# Patient Record
Sex: Male | Born: 1960 | Race: White | Hispanic: No | Marital: Married | Smoking: Never smoker
Health system: Southern US, Community
[De-identification: ages and names within clinical notes are randomized; demographics above are authoritative.]

## PROBLEM LIST (undated history)

## (undated) DIAGNOSIS — G709 Myoneural disorder, unspecified: Secondary | ICD-10-CM

## (undated) DIAGNOSIS — I499 Cardiac arrhythmia, unspecified: Secondary | ICD-10-CM

## (undated) DIAGNOSIS — K219 Gastro-esophageal reflux disease without esophagitis: Secondary | ICD-10-CM

## (undated) DIAGNOSIS — T753XXA Motion sickness, initial encounter: Secondary | ICD-10-CM

## (undated) DIAGNOSIS — S060X9A Concussion with loss of consciousness of unspecified duration, initial encounter: Secondary | ICD-10-CM

## (undated) DIAGNOSIS — J45909 Unspecified asthma, uncomplicated: Secondary | ICD-10-CM

## (undated) HISTORY — PX: NASAL SINUS SURGERY: SHX719

## (undated) HISTORY — PX: TONSILLECTOMY: SUR1361

---

## 2004-03-21 ENCOUNTER — Ambulatory Visit: Payer: Self-pay | Admitting: Otolaryngology

## 2005-03-11 ENCOUNTER — Ambulatory Visit: Payer: Self-pay | Admitting: Internal Medicine

## 2005-05-22 ENCOUNTER — Ambulatory Visit: Payer: Self-pay | Admitting: Gastroenterology

## 2009-04-30 ENCOUNTER — Ambulatory Visit: Payer: Self-pay | Admitting: Internal Medicine

## 2011-07-20 ENCOUNTER — Ambulatory Visit: Payer: Self-pay | Admitting: Internal Medicine

## 2013-10-13 DIAGNOSIS — J302 Other seasonal allergic rhinitis: Secondary | ICD-10-CM | POA: Insufficient documentation

## 2013-10-13 DIAGNOSIS — J4530 Mild persistent asthma, uncomplicated: Secondary | ICD-10-CM | POA: Insufficient documentation

## 2013-10-13 DIAGNOSIS — K219 Gastro-esophageal reflux disease without esophagitis: Secondary | ICD-10-CM | POA: Insufficient documentation

## 2014-04-15 ENCOUNTER — Emergency Department: Payer: Self-pay | Admitting: Emergency Medicine

## 2014-04-15 LAB — CBC
HCT: 43.6 % (ref 40.0–52.0)
HGB: 14.3 g/dL (ref 13.0–18.0)
MCH: 30.4 pg (ref 26.0–34.0)
MCHC: 32.8 g/dL (ref 32.0–36.0)
MCV: 93 fL (ref 80–100)
PLATELETS: 193 10*3/uL (ref 150–440)
RBC: 4.69 10*6/uL (ref 4.40–5.90)
RDW: 12.6 % (ref 11.5–14.5)
WBC: 9.2 10*3/uL (ref 3.8–10.6)

## 2014-04-15 LAB — COMPREHENSIVE METABOLIC PANEL
ALBUMIN: 3.6 g/dL (ref 3.4–5.0)
ALK PHOS: 45 U/L — AB
ALT: 29 U/L
Anion Gap: 7 (ref 7–16)
BUN: 10 mg/dL (ref 7–18)
Bilirubin,Total: 0.8 mg/dL (ref 0.2–1.0)
CALCIUM: 8.4 mg/dL — AB (ref 8.5–10.1)
CO2: 24 mmol/L (ref 21–32)
Chloride: 110 mmol/L — ABNORMAL HIGH (ref 98–107)
Creatinine: 0.9 mg/dL (ref 0.60–1.30)
EGFR (African American): >60
GLUCOSE: 94 mg/dL (ref 65–99)
Osmolality: 280 (ref 275–301)
Potassium: 3.9 mmol/L (ref 3.5–5.1)
SGOT(AST): 33 U/L (ref 15–37)
Sodium: 141 mmol/L (ref 136–145)
Total Protein: 6.6 g/dL (ref 6.4–8.2)

## 2014-04-15 LAB — TROPONIN I: Troponin-I: <0.02 ng/mL

## 2014-06-01 ENCOUNTER — Ambulatory Visit: Payer: Self-pay | Admitting: Family Medicine

## 2014-06-09 ENCOUNTER — Ambulatory Visit: Payer: Self-pay | Admitting: Family Medicine

## 2014-08-01 DIAGNOSIS — S060X9A Concussion with loss of consciousness of unspecified duration, initial encounter: Secondary | ICD-10-CM

## 2014-08-01 DIAGNOSIS — S060XAA Concussion with loss of consciousness status unknown, initial encounter: Secondary | ICD-10-CM

## 2014-08-01 HISTORY — DX: Concussion with loss of consciousness of unspecified duration, initial encounter: S06.0X9A

## 2014-08-01 HISTORY — DX: Concussion with loss of consciousness status unknown, initial encounter: S06.0XAA

## 2014-08-04 ENCOUNTER — Emergency Department: Admit: 2014-08-04 | Disposition: A | Discharge status: Home / Self Care | Payer: Self-pay | Admitting: Internal Medicine

## 2014-08-15 ENCOUNTER — Encounter: Payer: Self-pay | Admitting: *Deleted

## 2014-08-18 NOTE — Discharge Instructions (Signed)

## 2014-08-19 ENCOUNTER — Ambulatory Visit: Payer: BLUE CROSS/BLUE SHIELD | Admitting: Anesthesiology

## 2014-08-19 ENCOUNTER — Encounter
Admission: RE | Disposition: A | Discharge status: Home / Self Care | Payer: Self-pay | Source: Ambulatory Visit | Attending: Gastroenterology

## 2014-08-19 ENCOUNTER — Ambulatory Visit
Admission: RE | Admit: 2014-08-19 | Discharge: 2014-08-19 | Disposition: A | Discharge status: Home / Self Care | Payer: BLUE CROSS/BLUE SHIELD | Source: Ambulatory Visit | Attending: Gastroenterology | Admitting: Gastroenterology

## 2014-08-19 DIAGNOSIS — D124 Benign neoplasm of descending colon: Secondary | ICD-10-CM | POA: Diagnosis not present

## 2014-08-19 DIAGNOSIS — G709 Myoneural disorder, unspecified: Secondary | ICD-10-CM | POA: Diagnosis not present

## 2014-08-19 DIAGNOSIS — I499 Cardiac arrhythmia, unspecified: Secondary | ICD-10-CM | POA: Diagnosis not present

## 2014-08-19 DIAGNOSIS — R1032 Left lower quadrant pain: Secondary | ICD-10-CM | POA: Insufficient documentation

## 2014-08-19 DIAGNOSIS — K219 Gastro-esophageal reflux disease without esophagitis: Secondary | ICD-10-CM | POA: Insufficient documentation

## 2014-08-19 DIAGNOSIS — K5730 Diverticulosis of large intestine without perforation or abscess without bleeding: Secondary | ICD-10-CM | POA: Diagnosis not present

## 2014-08-19 DIAGNOSIS — R1013 Epigastric pain: Secondary | ICD-10-CM | POA: Insufficient documentation

## 2014-08-19 DIAGNOSIS — K640 First degree hemorrhoids: Secondary | ICD-10-CM | POA: Diagnosis not present

## 2014-08-19 DIAGNOSIS — Z1211 Encounter for screening for malignant neoplasm of colon: Secondary | ICD-10-CM | POA: Insufficient documentation

## 2014-08-19 DIAGNOSIS — J45909 Unspecified asthma, uncomplicated: Secondary | ICD-10-CM | POA: Diagnosis not present

## 2014-08-19 DIAGNOSIS — Z79899 Other long term (current) drug therapy: Secondary | ICD-10-CM | POA: Diagnosis not present

## 2014-08-19 DIAGNOSIS — K635 Polyp of colon: Secondary | ICD-10-CM | POA: Insufficient documentation

## 2014-08-19 DIAGNOSIS — K2970 Gastritis, unspecified, without bleeding: Secondary | ICD-10-CM | POA: Diagnosis not present

## 2014-08-19 DIAGNOSIS — Z9889 Other specified postprocedural states: Secondary | ICD-10-CM | POA: Insufficient documentation

## 2014-08-19 HISTORY — DX: Motion sickness, initial encounter: T75.3XXA

## 2014-08-19 HISTORY — DX: Cardiac arrhythmia, unspecified: I49.9

## 2014-08-19 HISTORY — DX: Concussion with loss of consciousness of unspecified duration, initial encounter: S06.0X9A

## 2014-08-19 HISTORY — PX: COLONOSCOPY: SHX5424

## 2014-08-19 HISTORY — PX: ESOPHAGOGASTRODUODENOSCOPY: SHX5428

## 2014-08-19 HISTORY — DX: Gastro-esophageal reflux disease without esophagitis: K21.9

## 2014-08-19 HISTORY — DX: Unspecified asthma, uncomplicated: J45.909

## 2014-08-19 HISTORY — DX: Myoneural disorder, unspecified: G70.9

## 2014-08-19 SURGERY — COLONOSCOPY
Anesthesia: Monitor Anesthesia Care | Wound class: Contaminated

## 2014-08-19 MED ORDER — ACETAMINOPHEN 325 MG PO TABS
325.0000 mg | ORAL_TABLET | ORAL | Status: DC | PRN
Start: 2014-08-19 — End: 2014-08-19

## 2014-08-19 MED ORDER — PROPOFOL 10 MG/ML IV BOLUS
INTRAVENOUS | Status: DC | PRN
  Administered 2014-08-19 (×3): 50 mg via INTRAVENOUS
  Administered 2014-08-19: 30 mg via INTRAVENOUS
  Administered 2014-08-19: 75 mg via INTRAVENOUS
  Administered 2014-08-19: 50 mg via INTRAVENOUS
  Administered 2014-08-19: 30 mg via INTRAVENOUS
  Administered 2014-08-19: 25 mg via INTRAVENOUS
  Administered 2014-08-19: 20 mg via INTRAVENOUS
  Administered 2014-08-19 (×2): 50 mg via INTRAVENOUS
  Administered 2014-08-19: 20 mg via INTRAVENOUS

## 2014-08-19 MED ORDER — ACETAMINOPHEN 160 MG/5ML PO SOLN
325.0000 mg | ORAL | Status: DC | PRN
Start: 2014-08-19 — End: 2014-08-19

## 2014-08-19 MED ORDER — STERILE WATER FOR IRRIGATION IR SOLN
Status: DC | PRN
  Administered 2014-08-19: 09:00:00

## 2014-08-19 MED ORDER — LACTATED RINGERS IV SOLN
INTRAVENOUS | Status: DC
  Administered 2014-08-19: 09:00:00 via INTRAVENOUS

## 2014-08-19 MED ORDER — GLYCOPYRROLATE 0.2 MG/ML IJ SOLN
INTRAMUSCULAR | Status: DC | PRN
  Administered 2014-08-19: 0.2 mg via INTRAVENOUS

## 2014-08-19 MED ORDER — LIDOCAINE HCL (CARDIAC) 20 MG/ML IV SOLN
INTRAVENOUS | Status: DC | PRN
  Administered 2014-08-19: 50 mg via INTRAVENOUS

## 2014-08-19 MED ORDER — SODIUM CHLORIDE 0.9 % IV SOLN: INTRAVENOUS | Status: DC

## 2014-08-19 SURGICAL SUPPLY — 38 items
BALLN DILATOR 10-12 8 (BALLOONS)
BALLN DILATOR 12-15 8 (BALLOONS)
BALLN DILATOR 15-18 8 (BALLOONS)
BALLN DILATOR CRE 0-12 8 (BALLOONS)
BALLN DILATOR ESOPH 8 10 CRE (MISCELLANEOUS) IMPLANT
BALLOON DILATOR 12-15 8 (BALLOONS) IMPLANT
BALLOON DILATOR 15-18 8 (BALLOONS) IMPLANT
BALLOON DILATOR CRE 0-12 8 (BALLOONS) IMPLANT
BLOCK BITE 60FR ADLT L/F GRN (MISCELLANEOUS) ×3 IMPLANT
CANISTER SUCT 1200ML W/VALVE (MISCELLANEOUS) ×3 IMPLANT
FCP ESCP3.2XJMB 240X2.8X (MISCELLANEOUS) ×1
FORCEPS BIOP RAD 4 LRG CAP 4 (CUTTING FORCEPS) IMPLANT
FORCEPS BIOP RJ4 240 W/NDL (MISCELLANEOUS) ×2
FORCEPS ESCP3.2XJMB 240X2.8X (MISCELLANEOUS) ×1 IMPLANT
GOWN CVR UNV OPN BCK APRN NK (MISCELLANEOUS) ×2 IMPLANT
GOWN ISOL THUMB LOOP REG UNIV (MISCELLANEOUS) ×4
HEMOCLIP INSTINCT (CLIP) IMPLANT
INJECTOR VARIJECT VIN23 (MISCELLANEOUS) IMPLANT
KIT CO2 TUBING (TUBING) ×3 IMPLANT
KIT DEFENDO VALVE AND CONN (KITS) IMPLANT
KIT ENDO PROCEDURE OLY (KITS) ×6 IMPLANT
LIGATOR MULTIBAND 6SHOOTER MBL (MISCELLANEOUS) IMPLANT
MARKER SPOT ENDO TATTOO 5ML (MISCELLANEOUS) IMPLANT
PAD GROUND ADULT SPLIT (MISCELLANEOUS) IMPLANT
SNARE SHORT THROW 13M SML OVAL (MISCELLANEOUS) IMPLANT
SNARE SHORT THROW 30M LRG OVAL (MISCELLANEOUS) IMPLANT
SPOT EX ENDOSCOPIC TATTOO (MISCELLANEOUS)
SUCTION POLY TRAP 4CHAMBER (MISCELLANEOUS) IMPLANT
SYR INFLATION 60ML (SYRINGE) IMPLANT
TRAP SUCTION POLY (MISCELLANEOUS) IMPLANT
TUBING CONN 6MMX3.1M (TUBING)
TUBING SUCTION CONN 0.25 STRL (TUBING) IMPLANT
UNDERPAD 30X60 958B10 (PK) (MISCELLANEOUS) IMPLANT
VALVE BIOPSY ENDO (VALVE) IMPLANT
VARIJECT INJECTOR VIN23 (MISCELLANEOUS)
WATER AUXILLARY (MISCELLANEOUS) ×3 IMPLANT
WATER STERILE IRR 500ML POUR (IV SOLUTION) ×3 IMPLANT
WIRE CRE 18-20MM 8CM F G (MISCELLANEOUS) IMPLANT

## 2014-08-19 NOTE — Anesthesia Procedure Notes (Signed)
Procedure Name: MAC Performed by: Cleva Camero Pre-anesthesia Checklist: Patient identified, Emergency Drugs available, Suction available, Timeout performed and Patient being monitored Patient Re-evaluated:Patient Re-evaluated prior to inductionOxygen Delivery Method: Nasal cannula Placement Confirmation: positive ETCO2     

## 2014-08-19 NOTE — Anesthesia Postprocedure Evaluation (Signed)
  Anesthesia Post-op Note  Patient: Ryan Miller  Procedure(s) Performed: Procedure(s): COLONOSCOPY (N/A) ESOPHAGOGASTRODUODENOSCOPY (EGD) (N/A)  Anesthesia type:MAC  Patient location: PACU  Post pain: Pain level controlled  Post assessment: Post-op Vital signs reviewed, Patient's Cardiovascular Status Stable, Respiratory Function Stable, Patent Airway and No signs of Nausea or vomiting  Post vital signs: Reviewed and stable  Last Vitals:  Filed Vitals:   08/19/14 0949  BP: 103/66  Pulse: 75  Temp:   Resp:     Level of consciousness: awake, alert  and patient cooperative  Complications: No apparent anesthesia complications

## 2014-08-19 NOTE — Transfer of Care (Signed)
Immediate Anesthesia Transfer of Care Note  Patient: Ryan Miller  Procedure(s) Performed: Procedure(s): COLONOSCOPY (N/A) ESOPHAGOGASTRODUODENOSCOPY (EGD) (N/A)  Patient Location: PACU  Anesthesia Type: MAC  Level of Consciousness: awake, alert  and patient cooperative  Airway and Oxygen Therapy: Patient Spontanous Breathing and Patient connected to supplemental oxygen  Post-op Assessment: Post-op Vital signs reviewed, Patient's Cardiovascular Status Stable, Respiratory Function Stable, Patent Airway and No signs of Nausea or vomiting  Post-op Vital Signs: Reviewed and stable  Complications: No apparent anesthesia complications

## 2014-08-19 NOTE — Op Note (Signed)
[facility] Patient Name: Ryan Miller Procedure Date: 08/19/2014 8:48 AM MRN: [id number] Account #: [id number] Date of Birth: 1961/04/10 Admit Type: Outpatient Age: 54 Room: [facility] OR ROOM 01 Gender: Male Note Status: Finalized Procedure:         Upper GI endoscopy Indications:       Epigastric abdominal pain, Abdominal pain in the left                     lower quadrant Providers:         Lucilla Lame, MD Medicines:         Propofol per Anesthesia Complications:     No immediate complications. Procedure:         Pre-Anesthesia Assessment:                    - Prior to the procedure, a History and Physical was                     performed, and patient medications and allergies were                     reviewed. The patient's tolerance of previous anesthesia                     was also reviewed. The risks and benefits of the procedure                     and the sedation options and risks were discussed with the                     patient. All questions were answered, and informed consent                     was obtained. Prior Anticoagulants: The patient has taken                     no previous anticoagulant or antiplatelet agents. ASA                     Grade Assessment: II - A patient with mild systemic                     disease. After reviewing the risks and benefits, the                     patient was deemed in satisfactory condition to undergo                     the procedure.                    After obtaining informed consent, the endoscope was passed                     under direct vision. Throughout the procedure, the                     patient's blood pressure, pulse, and oxygen saturations                     were monitored continuously. The Olympus GIF H180J                     colonscope (M#:[id number]) was introduced through the mouth,  and advanced to the second part of duodenum. The upper GI         endoscopy was accomplished without difficulty. The patient                     tolerated the procedure well. Findings:      The Z-line was irregular and was found at the gastroesophageal junction.      Scattered mild inflammation characterized by erythema was found in the       gastric antrum. Biopsies were taken with a cold forceps for histology.      The examined duodenum was normal. Biopsies were taken with a cold       forceps for histology. Impression:        - Z-line irregular, at the gastroesophageal junction.                    - Gastritis. Biopsied.                    - Normal examined duodenum. Biopsied. Recommendation:    - Await pathology results.                    - Perform a colonoscopy today. Procedure Code(s): --- Professional ---                    [phone], Esophagogastroduodenoscopy, flexible, transoral;                     with biopsy, single or multiple Diagnosis Code(s): --- Professional ---                    R10.32, Left lower quadrant pain                    K29.70, Gastritis, unspecified, without bleeding                    R10.13, Epigastric pain CPT copyright 2014 American Medical Association. All rights reserved. The codes documented in this report are preliminary and upon coder review may  be revised to meet current compliance requirements. Lucilla Lame, MD 08/19/2014 9:07:49 AM This report has been signed electronically. Number of Addenda: 0 Note Initiated On: 08/19/2014 8:48 AM Total Procedure Duration: 0 hours 3 minutes 5 seconds       [facility]

## 2014-08-19 NOTE — Op Note (Signed)
[facility] Patient Name: Ryan Miller Procedure Date: 08/19/2014 8:53 AM MRN: [id number] Account #: [id number] Date of Birth: [birth year] Admit Type: Outpatient Age: 54 Room: [facility] OR ROOM 01 Gender: Male Note Status: Finalized Procedure:         Colonoscopy Indications:       Screening for colorectal malignant neoplasm Providers:         Lucilla Lame, MD Medicines:         Propofol per Anesthesia Complications:     No immediate complications. Procedure:         Pre-Anesthesia Assessment:                    - Prior to the procedure, a History and Physical was                     performed, and patient medications and allergies were                     reviewed. The patient's tolerance of previous anesthesia                     was also reviewed. The risks and benefits of the procedure                     and the sedation options and risks were discussed with the                     patient. All questions were answered, and informed consent                     was obtained. Prior Anticoagulants: The patient has taken                     no previous anticoagulant or antiplatelet agents. ASA                     Grade Assessment: II - A patient with mild systemic                     disease. After reviewing the risks and benefits, the                     patient was deemed in satisfactory condition to undergo                     the procedure.                    After obtaining informed consent, the colonoscope was                     passed under direct vision. Throughout the procedure, the                     patient's blood pressure, pulse, and oxygen saturations                     were monitored continuously. The Olympus CF H180AL                     colonoscope (S#: [id number]) was introduced through the anus                     and advanced to the the cecum, identified by  appendiceal                     orifice and ileocecal valve. The colonoscopy  was performed                     without difficulty. The patient tolerated the procedure                     well. The quality of the bowel preparation was good. Findings:      The perianal and digital rectal examinations were normal.      A 4 mm polyp was found in the sigmoid colon. The polyp was sessile. The       polyp was removed with a cold biopsy forceps. Resection and retrieval       were complete.      A 4 mm polyp was found in the descending colon. The polyp was sessile.       The polyp was removed with a cold biopsy forceps. Resection and       retrieval were complete.      A few small-mouthed diverticula were found in the sigmoid colon.      Non-bleeding internal hemorrhoids were found during retroflexion. The       hemorrhoids were Grade I (internal hemorrhoids that do not prolapse). Impression:        - One 4 mm polyp in the sigmoid colon. Resected and                     retrieved.                    - One 4 mm polyp in the descending colon. Resected and                     retrieved.                    - Diverticulosis in the sigmoid colon.                    - Non-bleeding internal hemorrhoids. Recommendation:    - Await pathology results. Procedure Code(s): --- Professional ---                    [phone], Colonoscopy, flexible; with biopsy, single or                     multiple Diagnosis Code(s): --- Professional ---                    Z12.11, Encounter for screening for malignant neoplasm of                     colon                    D12.5, Benign neoplasm of sigmoid colon                    D12.4, Benign neoplasm of descending colon CPT copyright 2014 American Medical Association. All rights reserved. The codes documented in this report are preliminary and upon coder review may  be revised to meet current compliance requirements. Lucilla Lame, MD 08/19/2014 9:32:09 AM This report has been signed electronically. Number of Addenda: 0 Note Initiated On: 08/19/2014  8:53 AM Scope Withdrawal Time: 0 hours 14 minutes 48 seconds  Total  Procedure Duration: 0 hours 19 minutes 48 seconds       [facility]

## 2014-08-19 NOTE — Anesthesia Preprocedure Evaluation (Signed)
Anesthesia Evaluation  Patient identified by MRN, date of birth, ID band Patient awake    Reviewed: Allergy & Precautions, H&P , NPO status , Patient's Chart, lab work & pertinent test results, reviewed documented beta blocker date and time   Airway Mallampati: II  TM Distance: >3 FB Neck ROM: full    Dental no notable dental hx.    Pulmonary asthma ,  breath sounds clear to auscultation  Pulmonary exam normal       Cardiovascular Exercise Tolerance: Good + dysrhythmias Supra Ventricular Tachycardia Rhythm:regular Rate:Normal     Neuro/Psych negative neurological ROS  negative psych ROS   GI/Hepatic Neg liver ROS, GERD-  ,  Endo/Other  negative endocrine ROS  Renal/GU negative Renal ROS  negative genitourinary   Musculoskeletal   Abdominal   Peds  Hematology negative hematology ROS (+)   Anesthesia Other Findings   Reproductive/Obstetrics negative OB ROS                             Anesthesia Physical Anesthesia Plan  ASA: I  Anesthesia Plan: MAC   Post-op Pain Management:    Induction:   Airway Management Planned:   Additional Equipment:   Intra-op Plan:   Post-operative Plan:   Informed Consent: I have reviewed the patients History and Physical, chart, labs and discussed the procedure including the risks, benefits and alternatives for the proposed anesthesia with the patient or authorized representative who has indicated his/her understanding and acceptance.     Plan Discussed with: CRNA  Anesthesia Plan Comments: (Pt to use albuterol inhaler before procedure)        Anesthesia Quick Evaluation

## 2014-08-19 NOTE — H&P (Signed)
@  MEQA@  Primary Care Physician:  Erline Levine Primary Gastroenterologist:  Dr. Allen Norris  Pre-Procedure History & Physical: HPI:  Ryan Miller is a 54 y.o. male is here for an endoscopy and colonoscopy.   Past Medical History  Diagnosis Date  . Dysrhythmia     "Rapid whens leans to side"- Neg Stress 5-10 yrrs ago  . Asthma   . Neuromuscular disorder     L arm numbness - bulging discs  . GERD (gastroesophageal reflux disease)   . Motion sickness   . Concussion 08/01/2014    Past Surgical History  Procedure Laterality Date  . Tonsillectomy    . Nasal sinus surgery      Prior to Admission medications   Medication Sig Start Date End Date Taking? Authorizing Provider  azelastine (ASTELIN) 0.1 % nasal spray Place into both nostrils daily. Use in each nostril as directed   Yes Historical Provider, MD  budesonide-formoterol (SYMBICORT) 160-4.5 MCG/ACT inhaler Inhale 2 puffs into the lungs 2 (two) times daily.   Yes Historical Provider, MD  butalbital-acetaminophen-caffeine (FIORICET, ESGIC) 50-325-40 MG per tablet Take by mouth 2 (two) times daily as needed for headache.   Yes Historical Provider, MD  Cholecalciferol (VITAMIN D PO) Take by mouth.   Yes Historical Provider, MD  dexlansoprazole (DEXILANT) 60 MG capsule Take 60 mg by mouth daily. AM   Yes Historical Provider, MD  guaiFENesin (MUCINEX) 600 MG 12 hr tablet Take by mouth 2 (two) times daily as needed.   Yes Historical Provider, MD  loratadine (CLARITIN) 10 MG tablet Take 10 mg by mouth daily. AM   Yes Historical Provider, MD  montelukast (SINGULAIR) 10 MG tablet Take 10 mg by mouth at bedtime.   Yes Historical Provider, MD    Allergies as of 08/09/2014  . (Not on File)    History reviewed. No pertinent family history.  History   Social History  . Marital Status: Married    Spouse Name: N/A  . Number of Children: N/A  . Years of Education: N/A   Occupational History  . Not on file.   Social History Main Topics   . Smoking status: Never Smoker   . Smokeless tobacco: Not on file  . Alcohol Use: No  . Drug Use: Not on file  . Sexual Activity: Not on file   Other Topics Concern  . Not on file   Social History Narrative    Review of Systems: See HPI, otherwise negative ROS  Physical Exam: BP 110/80 mmHg  Pulse 73  Temp(Src) 98.1 F (36.7 C)  Ht 5\' 9"  (1.753 m)  Wt 196 lb (88.905 kg)  BMI 28.93 kg/m2  SpO2 99% General:   Alert,  pleasant and cooperative in NAD Head:  Normocephalic and atraumatic. Neck:  Supple; no masses or thyromegaly. Lungs:  Clear throughout to auscultation.    Heart:  Regular rate and rhythm. Abdomen:  Soft, nontender and nondistended. Normal bowel sounds, without guarding, and without rebound.   Neurologic:  Alert and  oriented x4;  grossly normal neurologically.  Impression/Plan: Ryan Miller is here for an endoscopy and colonoscopy to be performed for Screening and epigastric pain.  Risks, benefits, limitations, and alternatives regarding  endoscopy and colonoscopy have been reviewed with the patient.  Questions have been answered.  All parties agreeable.   [facility], MD  08/19/2014, 8:49 AM

## 2014-08-23 ENCOUNTER — Encounter: Payer: Self-pay | Admitting: Gastroenterology

## 2014-09-16 ENCOUNTER — Encounter: Payer: Self-pay | Admitting: Gastroenterology

## 2015-01-03 ENCOUNTER — Other Ambulatory Visit: Payer: Self-pay | Admitting: Family Medicine

## 2015-01-03 DIAGNOSIS — R1013 Epigastric pain: Secondary | ICD-10-CM

## 2015-01-17 ENCOUNTER — Ambulatory Visit
Admission: RE | Admit: 2015-01-17 | Discharge: 2015-01-17 | Disposition: A | Discharge status: Home / Self Care | Payer: BLUE CROSS/BLUE SHIELD | Source: Ambulatory Visit | Attending: Family Medicine | Admitting: Family Medicine

## 2015-01-17 DIAGNOSIS — N2889 Other specified disorders of kidney and ureter: Secondary | ICD-10-CM | POA: Insufficient documentation

## 2015-01-17 DIAGNOSIS — R1013 Epigastric pain: Secondary | ICD-10-CM | POA: Diagnosis present

## 2015-01-17 MED ORDER — IOHEXOL 300 MG/ML  SOLN
100.0000 mL | Freq: Once | INTRAMUSCULAR | Status: AC | PRN
  Administered 2015-01-17: 100 mL via INTRAVENOUS

## 2015-05-04 ENCOUNTER — Other Ambulatory Visit: Payer: Self-pay

## 2015-05-05 ENCOUNTER — Encounter: Payer: Self-pay | Admitting: Gastroenterology

## 2015-05-05 ENCOUNTER — Ambulatory Visit (INDEPENDENT_AMBULATORY_CARE_PROVIDER_SITE_OTHER): Payer: BLUE CROSS/BLUE SHIELD | Admitting: Gastroenterology

## 2015-05-05 VITALS — BP 130/78 | HR 77 | Temp 98.2°F | Ht 69.0 in | Wt 206.0 lb

## 2015-05-05 DIAGNOSIS — G8929 Other chronic pain: Secondary | ICD-10-CM

## 2015-05-05 DIAGNOSIS — R1013 Epigastric pain: Secondary | ICD-10-CM | POA: Diagnosis not present

## 2015-05-05 MED ORDER — DICYCLOMINE HCL 10 MG PO CAPS: 10.0000 mg | ORAL_CAPSULE | Freq: Three times a day (TID) | ORAL | Status: DC

## 2015-05-05 NOTE — Progress Notes (Signed)
Primary Care Physician: Erline Levine  Primary Gastroenterologist:  Dr. Lucilla Lame  Chief Complaint  Patient presents with  . Stomach issues    HPI: Ryan Miller is a 55 y.o. male here for continued abdominal pain. The patient reports that he has pain that is sometimes in his flanks and sometimes in the epigastric area and lower abdomen. The patient reports that the pain is worse when he is squatting down to catch when he is playing baseball his son. He also reports that his abdominal pain hurts when he sits in a car for a long period of time. The patient has also noticed that the discomfort is more noticeable at the end of the day when he is been getting up from his desk and moving around a lot. He also reports that leaning on the desk also causes him some discomfort. The patient reports that he also has been eating much smaller meals and he feels full quicker. He had an EGD and colonoscopy that showed no cause for these symptoms. He has also gained 10 pounds since May of last year.  Current Outpatient Prescriptions  Medication Sig Dispense Refill  . albuterol (PROAIR HFA) 108 (90 Base) MCG/ACT inhaler     . azelastine (ASTELIN) 0.1 % nasal spray Place into both nostrils daily. Use in each nostril as directed    . budesonide-formoterol (SYMBICORT) 160-4.5 MCG/ACT inhaler Inhale 2 puffs into the lungs 2 (two) times daily.    . budesonide-formoterol (SYMBICORT) 160-4.5 MCG/ACT inhaler Inhale into the lungs.    . Cholecalciferol (VITAMIN D PO) Take by mouth.    . loratadine (CLARITIN) 10 MG tablet Take 10 mg by mouth daily. AM    . montelukast (SINGULAIR) 10 MG tablet Take 10 mg by mouth at bedtime.    . ranitidine (ZANTAC) 150 MG tablet Take 150 mg by mouth 2 (two) times daily.    Marland Kitchen dexlansoprazole (DEXILANT) 60 MG capsule Take 60 mg by mouth daily. Reported on 05/05/2015    . dicyclomine (BENTYL) 10 MG capsule Take 1 capsule (10 mg total) by mouth 3 (three) times daily before meals. 60  capsule 2  . guaiFENesin (MUCINEX) 600 MG 12 hr tablet Take by mouth 2 (two) times daily as needed. Reported on 05/05/2015     No current facility-administered medications for this visit.    Allergies as of 05/05/2015 - Review Complete 05/05/2015  Allergen Reaction Noted  . Peanuts [peanut oil] Swelling 08/15/2014    ROS:  General: Negative for anorexia, weight loss, fever, chills, fatigue, weakness. ENT: Negative for hoarseness, difficulty swallowing , nasal congestion. CV: Negative for chest pain, angina, palpitations, dyspnea on exertion, peripheral edema.  Respiratory: Negative for dyspnea at rest, dyspnea on exertion, cough, sputum, wheezing.  GI: See history of present illness. GU:  Negative for dysuria, hematuria, urinary incontinence, urinary frequency, nocturnal urination.  Endo: Negative for unusual weight change.    Physical Examination:   BP 130/78 mmHg  Pulse 77  Temp(Src) 98.2 F (36.8 C) (Oral)  Ht 5\' 9"  (1.753 m)  Wt 206 lb (93.441 kg)  BMI 30.41 kg/m2  General: Well-nourished, well-developed in no acute distress.  Eyes: No icterus. Conjunctivae pink. Mouth: Oropharyngeal mucosa moist and pink , no lesions erythema or exudate. Lungs: Clear to auscultation bilaterally. Non-labored. Heart: Regular rate and rhythm, no murmurs rubs or gallops.  Abdomen: Bowel sounds are normal, nontender, nondistended, no hepatosplenomegaly or masses, no abdominal bruits, no rebound or guarding. There is a ventral hernia  with tenderness at the apex of the hernia   Extremities: No lower extremity edema. No clubbing or deformities. Neuro: Alert and oriented x 3.  Grossly intact. Skin: Warm and dry, no jaundice.   Psych: Alert and cooperative, normal mood and affect.  Labs:    Imaging Studies: No results found.  Assessment and Plan:   Ryan Miller is a 55 y.o. y/o male who comes in today with abdominal pain that was noticed in January and has progressed with worsening of  the pain in February. The patient states that the pain is noticeable when he bends over [place] down and when he has been sitting in a car for a long period of time. He also notices that it happens later in that day when he is been walking around a lot. On physical exam there is some tenderness to palpation at the apex of the ventral hernia. The patient also reports that he has not been able to eat as much as he used to do with states that he feels bloated and full shortly after eating with his family. Despite this the patient has gained 10 pounds since the middle of last year. He has been told that he likely has abdominal wall pain but he seems resistant to go along with this possibility. He has also been told that he may be having some irritable bowel syndrome with bloating. He will be started on a trial of dicyclomine 10 mg 3 times a day to see if this helps his symptoms. He does report that his Zantac has been keeping his reflux under control.   Note: This dictation was prepared with Dragon dictation along with smaller phrase technology. Any transcriptional errors that result from this process are unintentional.

## 2015-05-08 ENCOUNTER — Ambulatory Visit: Payer: Self-pay | Admitting: Gastroenterology

## 2017-06-02 ENCOUNTER — Ambulatory Visit
Admission: EM | Admit: 2017-06-02 | Discharge: 2017-06-02 | Disposition: A | Discharge status: Home / Self Care | Payer: BLUE CROSS/BLUE SHIELD | Attending: Family Medicine | Admitting: Family Medicine

## 2017-06-02 ENCOUNTER — Other Ambulatory Visit: Payer: Self-pay

## 2017-06-02 ENCOUNTER — Encounter: Payer: Self-pay | Admitting: Emergency Medicine

## 2017-06-02 DIAGNOSIS — B356 Tinea cruris: Secondary | ICD-10-CM

## 2017-06-02 MED ORDER — NYSTATIN-TRIAMCINOLONE 100000-0.1 UNIT/GM-% EX CREA: TOPICAL_CREAM | CUTANEOUS | 0 refills | Status: DC

## 2017-06-02 MED ORDER — FLUCONAZOLE 150 MG PO TABS: ORAL_TABLET | ORAL | 0 refills | Status: DC

## 2017-06-02 NOTE — ED Provider Notes (Signed)
[facility]    CSN: [id number] Arrival date & time: 06/02/17  1433     History   Chief Complaint Chief Complaint  Patient presents with  . Rash    HPI Ryan Miller is a 57 y.o. male.    Rash  Location:  Pelvis Pelvic rash location:  Perineum, groin, L buttock and R buttock Quality: itchiness, redness and scaling   Severity:  Moderate Onset quality:  Sudden Duration:  2 weeks Timing:  Constant Progression:  Worsening Chronicity:  New Context: not animal contact, not chemical exposure, not diapers, not eggs, not exposure to similar rash, not food, not hot tub use, not insect bite/sting, not medications, not new detergent/soap, not nuts, not plant contact, not pollen, not pregnancy, not sick contacts and not sun exposure   Relieved by:  Nothing Ineffective treatments:  Anti-fungal cream Associated symptoms: no abdominal pain, no diarrhea, no fatigue, no fever, no headaches, no hoarse voice, no induration, no joint pain, no myalgias, no nausea, no periorbital edema, no shortness of breath, no sore throat, no throat swelling, no tongue swelling, no URI, not vomiting and not wheezing     Past Medical History:  Diagnosis Date  . Asthma   . Concussion 08/01/2014  . Dysrhythmia    "Rapid whens leans to side"- Neg Stress 5-10 yrrs ago  . GERD (gastroesophageal reflux disease)   . Motion sickness   . Neuromuscular disorder (HCC)    L arm numbness - bulging discs    Patient Active Problem List   Diagnosis Date Noted  . Acid reflux 10/13/2013  . Asthma, mild persistent 10/13/2013  . Allergic rhinitis, seasonal 10/13/2013    Past Surgical History:  Procedure Laterality Date  . COLONOSCOPY N/A 08/19/2014   Procedure: COLONOSCOPY;  Surgeon: Lucilla Lame, MD;  Location: [place];  Service: Gastroenterology;  Laterality: N/A;  . ESOPHAGOGASTRODUODENOSCOPY N/A 08/19/2014   Procedure: ESOPHAGOGASTRODUODENOSCOPY (EGD);  Surgeon: Lucilla Lame, MD;  Location:  [place];  Service: Gastroenterology;  Laterality: N/A;  . NASAL SINUS SURGERY    . TONSILLECTOMY         Home Medications    Prior to Admission medications   Medication Sig Start Date End Date Taking? Authorizing Provider  Cholecalciferol (VITAMIN D PO) Take by mouth.   Yes [provider]  fluticasone furoate-vilanterol (BREO ELLIPTA) 100-25 MCG/INH AEPB Inhale 1 puff into the lungs daily.   Yes [provider]  loratadine (CLARITIN) 10 MG tablet Take 10 mg by mouth daily. AM   Yes [provider]  montelukast (SINGULAIR) 10 MG tablet Take 10 mg by mouth at bedtime.   Yes [provider]  ranitidine (ZANTAC) 150 MG tablet Take 150 mg by mouth 2 (two) times daily.   Yes [provider]  albuterol (PROAIR HFA) 108 (90 Base) MCG/ACT inhaler  09/22/14   [provider]  azelastine (ASTELIN) 0.1 % nasal spray Place into both nostrils daily. Use in each nostril as directed    [provider]  budesonide-formoterol (SYMBICORT) 160-4.5 MCG/ACT inhaler Inhale 2 puffs into the lungs 2 (two) times daily.    [provider]  budesonide-formoterol (SYMBICORT) 160-4.5 MCG/ACT inhaler Inhale into the lungs.    [provider]  dexlansoprazole (DEXILANT) 60 MG capsule Take 60 mg by mouth daily. Reported on 05/05/2015    [provider]  dicyclomine (BENTYL) 10 MG capsule Take 1 capsule (10 mg total) by mouth 3 (three) times daily before meals. 05/05/15  Lucilla Lame, MD  fluconazole (DIFLUCAN) 150 MG tablet Take 1 tablet po once, then repeat in 1 week 06/02/17   Norval Gable, MD  guaiFENesin (MUCINEX) 600 MG 12 hr tablet Take by mouth 2 (two) times daily as needed. Reported on 05/05/2015    [provider]  nystatin-triamcinolone Lilyan Gilford II) cream Apply to affected area daily 06/02/17   Norval Gable, MD    Family History History reviewed. No pertinent family history.  Social History Social  History   Tobacco Use  . Smoking status: Never Smoker  . Smokeless tobacco: Never Used  Substance Use Topics  . Alcohol use: No  . Drug use: No     Allergies   Peanuts [peanut oil]   Review of Systems Review of Systems  Constitutional: Negative for fatigue and fever.  HENT: Negative for hoarse voice and sore throat.   Respiratory: Negative for shortness of breath and wheezing.   Gastrointestinal: Negative for abdominal pain, diarrhea, nausea and vomiting.  Musculoskeletal: Negative for arthralgias and myalgias.  Skin: Positive for rash.  Neurological: Negative for headaches.     Physical Exam Triage Vital Signs ED Triage Vitals  Enc Vitals Group     BP 06/02/17 1445 128/78     Pulse Rate 06/02/17 1445 76     Resp 06/02/17 1445 16     Temp 06/02/17 1445 97.7 F (36.5 C)     Temp Source 06/02/17 1445 Oral     SpO2 06/02/17 1445 96 %     Weight 06/02/17 1442 206 lb (93.4 kg)     Height 06/02/17 1442 5\' 9"  (1.753 m)     Head Circumference --      Peak Flow --      Pain Score 06/02/17 1442 0     Pain Loc --      Pain Edu? --      Excl. in [place]? --    No data found.  Updated Vital Signs BP 128/78 (BP Location: Left Arm)   Pulse 76   Temp 97.7 F (36.5 C) (Oral)   Resp 16   Ht 5\' 9"  (1.753 m)   Wt 206 lb (93.4 kg)   SpO2 96%   BMI 30.42 kg/m   Visual Acuity Right Eye Distance:   Left Eye Distance:   Bilateral Distance:    Right Eye Near:   Left Eye Near:    Bilateral Near:     Physical Exam  Constitutional: He appears well-developed and well-nourished. No distress.  Skin: Rash noted. He is not diaphoretic. There is erythema.     Nursing note and vitals reviewed.    UC Treatments / Results  Labs (all labs ordered are listed, but only abnormal results are displayed) Labs Reviewed - No data to display  EKG  EKG Interpretation None       Radiology No results found.  Procedures Procedures (including critical care time)  Medications  Ordered in UC Medications - No data to display   Initial Impression / Assessment and Plan / UC Course  I have reviewed the triage vital signs and the nursing notes.  Pertinent labs & imaging results that were available during my care of the patient were reviewed by me and considered in my medical decision making (see chart for details).       Final Clinical Impressions(s) / UC Diagnoses   Final diagnoses:  Tinea cruris    ED Discharge Orders        Ordered  fluconazole (DIFLUCAN) 150 MG tablet     06/02/17 1524    nystatin-triamcinolone (MYCOLOG II) cream     06/02/17 1524     1. diagnosis reviewed with patient 2. rx as per orders above; reviewed possible side effects, interactions, risks and benefits  3.Follow-up prn if symptoms worsen or don't improve  Controlled Substance Prescriptions [place] Controlled Substance Registry consulted? Not Applicable   Norval Gable, MD 06/02/17 1534

## 2017-06-02 NOTE — ED Triage Notes (Signed)
Patient c/o rash on his buttock and groin area for the past 2 weeks.

## 2017-06-17 ENCOUNTER — Ambulatory Visit
Admission: EM | Admit: 2017-06-17 | Discharge: 2017-06-17 | Disposition: A | Discharge status: Home / Self Care | Payer: BLUE CROSS/BLUE SHIELD | Attending: Family Medicine | Admitting: Family Medicine

## 2017-06-17 ENCOUNTER — Other Ambulatory Visit: Payer: Self-pay

## 2017-06-17 DIAGNOSIS — R05 Cough: Secondary | ICD-10-CM

## 2017-06-17 DIAGNOSIS — J111 Influenza due to unidentified influenza virus with other respiratory manifestations: Secondary | ICD-10-CM

## 2017-06-17 DIAGNOSIS — M791 Myalgia, unspecified site: Secondary | ICD-10-CM | POA: Diagnosis not present

## 2017-06-17 DIAGNOSIS — R69 Illness, unspecified: Secondary | ICD-10-CM | POA: Diagnosis not present

## 2017-06-17 DIAGNOSIS — R5383 Other fatigue: Secondary | ICD-10-CM

## 2017-06-17 DIAGNOSIS — R509 Fever, unspecified: Secondary | ICD-10-CM

## 2017-06-17 MED ORDER — OSELTAMIVIR PHOSPHATE 75 MG PO CAPS: 75.0000 mg | ORAL_CAPSULE | Freq: Two times a day (BID) | ORAL | 0 refills | Status: DC

## 2017-06-17 MED ORDER — GUAIFENESIN-CODEINE 100-10 MG/5ML PO SOLN: ORAL | 0 refills | Status: DC

## 2017-06-17 MED ORDER — IBUPROFEN 800 MG PO TABS
800.0000 mg | ORAL_TABLET | Freq: Once | ORAL | Status: AC
  Administered 2017-06-17: 800 mg via ORAL

## 2017-06-17 NOTE — ED Triage Notes (Signed)
Patient complains of cough, congestion, body aches, fever that started yesterday.

## 2017-06-17 NOTE — ED Provider Notes (Signed)
[facility]    CSN: [id number] Arrival date & time: 06/17/17  1014     History   Chief Complaint Chief Complaint  Patient presents with  . Cough    HPI Ryan Miller is a 57 y.o. male.   The history is provided by the patient.  Cough  Associated symptoms: fever, headaches, myalgias and rhinorrhea   Associated symptoms: no wheezing   URI  Presenting symptoms: congestion, cough, fatigue, fever and rhinorrhea   Severity:  Moderate Onset quality:  Sudden Duration:  1 day Timing:  Constant Progression:  Worsening Chronicity:  New Relieved by:  None tried Ineffective treatments:  None tried Associated symptoms: arthralgias, headaches and myalgias   Associated symptoms: no wheezing   Risk factors: sick contacts   Risk factors: not elderly, no chronic cardiac disease, no chronic kidney disease, no chronic respiratory disease, no diabetes mellitus, no immunosuppression, no recent illness and no recent travel     Past Medical History:  Diagnosis Date  . Asthma   . Concussion 08/01/2014  . Dysrhythmia    "Rapid whens leans to side"- Neg Stress 5-10 yrrs ago  . GERD (gastroesophageal reflux disease)   . Motion sickness   . Neuromuscular disorder (HCC)    L arm numbness - bulging discs    Patient Active Problem List   Diagnosis Date Noted  . Acid reflux 10/13/2013  . Asthma, mild persistent 10/13/2013  . Allergic rhinitis, seasonal 10/13/2013    Past Surgical History:  Procedure Laterality Date  . COLONOSCOPY N/A 08/19/2014   Procedure: COLONOSCOPY;  Surgeon: Lucilla Lame, MD;  Location: [place];  Service: Gastroenterology;  Laterality: N/A;  . ESOPHAGOGASTRODUODENOSCOPY N/A 08/19/2014   Procedure: ESOPHAGOGASTRODUODENOSCOPY (EGD);  Surgeon: Lucilla Lame, MD;  Location: [place];  Service: Gastroenterology;  Laterality: N/A;  . NASAL SINUS SURGERY    . TONSILLECTOMY         Home Medications    Prior to Admission medications     Medication Sig Start Date End Date Taking? Authorizing Provider  albuterol (PROAIR HFA) 108 (90 Base) MCG/ACT inhaler  09/22/14  Yes [provider]  azelastine (ASTELIN) 0.1 % nasal spray Place into both nostrils daily. Use in each nostril as directed   Yes [provider]  Cholecalciferol (VITAMIN D PO) Take by mouth.   Yes [provider]  fluticasone furoate-vilanterol (BREO ELLIPTA) 100-25 MCG/INH AEPB Inhale 1 puff into the lungs daily.   Yes [provider]  guaiFENesin (MUCINEX) 600 MG 12 hr tablet Take by mouth 2 (two) times daily as needed. Reported on 05/05/2015   Yes [provider]  loratadine (CLARITIN) 10 MG tablet Take 10 mg by mouth daily. AM   Yes [provider]  montelukast (SINGULAIR) 10 MG tablet Take 10 mg by mouth at bedtime.   Yes [provider]  nystatin-triamcinolone (MYCOLOG II) cream Apply to affected area daily 06/02/17  Yes Elaf Clauson, Linward Foster, MD  ranitidine (ZANTAC) 150 MG tablet Take 150 mg by mouth 2 (two) times daily.   Yes [provider]  budesonide-formoterol (SYMBICORT) 160-4.5 MCG/ACT inhaler Inhale 2 puffs into the lungs 2 (two) times daily.    [provider]  budesonide-formoterol (SYMBICORT) 160-4.5 MCG/ACT inhaler Inhale into the lungs.    [provider]  dexlansoprazole (DEXILANT) 60 MG capsule Take 60 mg by mouth daily. Reported on 05/05/2015    [provider]  dicyclomine (BENTYL) 10 MG capsule Take 1 capsule (10 mg total) by  mouth 3 (three) times daily before meals. 05/05/15   Lucilla Lame, MD  fluconazole (DIFLUCAN) 150 MG tablet Take 1 tablet po once, then repeat in 1 week 06/02/17   Norval Gable, MD  guaiFENesin-codeine 100-10 MG/5ML syrup 5-10 ml po q 8 hours prn cough 06/17/17   Norval Gable, MD  oseltamivir (TAMIFLU) 75 MG capsule Take 1 capsule (75 mg total) by mouth 2 (two) times daily. 06/17/17   Norval Gable, MD    Family History History  reviewed. No pertinent family history.  Social History Social History   Tobacco Use  . Smoking status: Never Smoker  . Smokeless tobacco: Never Used  Substance Use Topics  . Alcohol use: No  . Drug use: No     Allergies   Peanuts [peanut oil]   Review of Systems Review of Systems  Constitutional: Positive for fatigue and fever.  HENT: Positive for congestion and rhinorrhea.   Respiratory: Positive for cough. Negative for wheezing.   Musculoskeletal: Positive for arthralgias and myalgias.  Neurological: Positive for headaches.     Physical Exam Triage Vital Signs ED Triage Vitals  Enc Vitals Group     BP 06/17/17 1032 129/70     Pulse Rate 06/17/17 1032 96     Resp 06/17/17 1032 18     Temp 06/17/17 1032 (!) 101.6 F (38.7 C)     Temp Source 06/17/17 1032 Oral     SpO2 06/17/17 1032 99 %     Weight 06/17/17 1030 206 lb (93.4 kg)     Height 06/17/17 1030 5\' 9"  (1.753 m)     Head Circumference --      Peak Flow --      Pain Score 06/17/17 1030 1     Pain Loc --      Pain Edu? --      Excl. in [place]? --    No data found.  Updated Vital Signs BP 129/70 (BP Location: Left Arm)   Pulse 96   Temp (!) 101.6 F (38.7 C) (Oral)   Resp 18   Ht 5\' 9"  (1.753 m)   Wt 206 lb (93.4 kg)   SpO2 99%   BMI 30.42 kg/m   Visual Acuity Right Eye Distance:   Left Eye Distance:   Bilateral Distance:    Right Eye Near:   Left Eye Near:    Bilateral Near:     Physical Exam  Constitutional: He appears well-developed and well-nourished. No distress.  HENT:  Head: Normocephalic and atraumatic.  Right Ear: Tympanic membrane, external ear and ear canal normal.  Left Ear: Tympanic membrane, external ear and ear canal normal.  Nose: Nose normal.  Mouth/Throat: Uvula is midline, oropharynx is clear and moist and mucous membranes are normal. No oropharyngeal exudate or tonsillar abscesses.  Eyes: Conjunctivae are normal. Right eye exhibits no discharge. Left eye exhibits no  discharge. No scleral icterus.  Neck: Normal range of motion. Neck supple. No tracheal deviation present. No thyromegaly present.  Cardiovascular: Normal rate, regular rhythm and normal heart sounds.  Pulmonary/Chest: Effort normal and breath sounds normal. No stridor. No respiratory distress. He has no wheezes. He has no rales. He exhibits no tenderness.  Lymphadenopathy:    He has no cervical adenopathy.  Neurological: He is alert.  Skin: Skin is warm and dry. No rash noted. He is not diaphoretic.  Nursing note and vitals reviewed.    UC Treatments / Results  Labs (all labs ordered are listed, but only abnormal results  are displayed) Labs Reviewed - No data to display  EKG  EKG Interpretation None       Radiology No results found.  Procedures Procedures (including critical care time)  Medications Ordered in UC Medications  ibuprofen (ADVIL,MOTRIN) tablet 800 mg (800 mg Oral Given 06/17/17 1036)     Initial Impression / Assessment and Plan / UC Course  I have reviewed the triage vital signs and the nursing notes.  Pertinent labs & imaging results that were available during my care of the patient were reviewed by me and considered in my medical decision making (see chart for details).       Final Clinical Impressions(s) / UC Diagnoses   Final diagnoses:  Influenza-like illness    ED Discharge Orders        Ordered    oseltamivir (TAMIFLU) 75 MG capsule  2 times daily     06/17/17 1117    guaiFENesin-codeine 100-10 MG/5ML syrup  Status:  Discontinued     06/17/17 1118    guaiFENesin-codeine 100-10 MG/5ML syrup     06/17/17 1136     1. diagnosis reviewed with patient 2. rx as per orders above; reviewed possible side effects, interactions, risks and benefits  3. Recommend supportive treatment with rest, fluids, otc analgesics 4. Follow-up prn if symptoms worsen or don't improve  Controlled Substance Prescriptions [place] Controlled Substance Registry  consulted? Not Applicable   Norval Gable, MD 06/17/17 1139

## 2017-06-22 ENCOUNTER — Ambulatory Visit
Admission: EM | Admit: 2017-06-22 | Discharge: 2017-06-22 | Disposition: A | Discharge status: Home / Self Care | Payer: BLUE CROSS/BLUE SHIELD | Attending: Emergency Medicine | Admitting: Emergency Medicine

## 2017-06-22 ENCOUNTER — Other Ambulatory Visit: Payer: Self-pay

## 2017-06-22 ENCOUNTER — Ambulatory Visit (INDEPENDENT_AMBULATORY_CARE_PROVIDER_SITE_OTHER): Payer: BLUE CROSS/BLUE SHIELD

## 2017-06-22 DIAGNOSIS — J181 Lobar pneumonia, unspecified organism: Secondary | ICD-10-CM

## 2017-06-22 DIAGNOSIS — R05 Cough: Secondary | ICD-10-CM | POA: Diagnosis not present

## 2017-06-22 DIAGNOSIS — J189 Pneumonia, unspecified organism: Secondary | ICD-10-CM

## 2017-06-22 MED ORDER — AZITHROMYCIN 500 MG PO TABS: 500.0000 mg | ORAL_TABLET | Freq: Every day | ORAL | 0 refills | Status: AC

## 2017-06-22 MED ORDER — IPRATROPIUM-ALBUTEROL 0.5-2.5 (3) MG/3ML IN SOLN
3.0000 mL | Freq: Four times a day (QID) | RESPIRATORY_TRACT | Status: DC
  Administered 2017-06-22 (×2): 3 mL via RESPIRATORY_TRACT

## 2017-06-22 MED ORDER — AEROCHAMBER PLUS MISC: 2 refills | Status: DC

## 2017-06-22 MED ORDER — ALBUTEROL SULFATE HFA 108 (90 BASE) MCG/ACT IN AERS: 2.0000 | INHALATION_SPRAY | RESPIRATORY_TRACT | 0 refills | Status: AC | PRN

## 2017-06-22 MED ORDER — PREDNISONE 50 MG PO TABS: 50.0000 mg | ORAL_TABLET | Freq: Every day | ORAL | 0 refills | Status: AC

## 2017-06-22 NOTE — ED Triage Notes (Signed)
Pt reports he had flu on Monday and was starting to feel better but now "it's gone into my bronchial tubes." Has had cough productive of clear phlegm until this a.m. And it turned white. Reports fever on Friday low-grade and yesterday 101

## 2017-06-22 NOTE — ED Provider Notes (Signed)
HPI  SUBJECTIVE:  Ryan Miller is a 57 y.o. male who presents with fever to 101, nasal congestion, frontal sinus pain and pressure, sore, substernal chest pain, dyspnea on exertion for the past several days. Seen here 5 days ago, found to have an influenza-like illness.  Sent home with Tamiflu, Cheratussin.  States that he got better 3 days ago and then got worse.  He denies postnasal drip, sore throat.  He reports a cough productive of white sputum.  No wheezing, shortness of breath.  Reports shortness of breath with exertion.  States that he is sleeping okay at night.  He states that he finished the Tamiflu and has been taking the Cheratussin and ibuprofen 400 mg.  The ibuprofen helps.  No aggravating factors.  Has a past medical history of asthma.  He is not a smoker.  No history of diabetes, hypertension, HIV, immunocompromise.  WUJ:[id number], Jefm Bryant   Past Medical History:  Diagnosis Date  . Asthma   . Concussion 08/01/2014  . Dysrhythmia    "Rapid whens leans to side"- Neg Stress 5-10 yrrs ago  . GERD (gastroesophageal reflux disease)   . Motion sickness   . Neuromuscular disorder (HCC)    L arm numbness - bulging discs    Past Surgical History:  Procedure Laterality Date  . COLONOSCOPY N/A 08/19/2014   Procedure: COLONOSCOPY;  Surgeon: Lucilla Lame, MD;  Location: [place];  Service: Gastroenterology;  Laterality: N/A;  . ESOPHAGOGASTRODUODENOSCOPY N/A 08/19/2014   Procedure: ESOPHAGOGASTRODUODENOSCOPY (EGD);  Surgeon: Lucilla Lame, MD;  Location: [place];  Service: Gastroenterology;  Laterality: N/A;  . NASAL SINUS SURGERY    . TONSILLECTOMY      History reviewed. No pertinent family history.  Social History   Tobacco Use  . Smoking status: Never Smoker  . Smokeless tobacco: Never Used  Substance Use Topics  . Alcohol use: No  . Drug use: No    No current facility-administered medications for this encounter.   Current Outpatient Medications:   .  albuterol (PROAIR HFA) 108 (90 Base) MCG/ACT inhaler, Inhale 2 puffs into the lungs every 4 (four) hours as needed for wheezing or shortness of breath., Disp: 1 Inhaler, Rfl: 0 .  azelastine (ASTELIN) 0.1 % nasal spray, Place into both nostrils daily. Use in each nostril as directed, Disp: , Rfl:  .  azithromycin (ZITHROMAX) 500 MG tablet, Take 1 tablet (500 mg total) by mouth daily for 5 days., Disp: 5 tablet, Rfl: 0 .  budesonide-formoterol (SYMBICORT) 160-4.5 MCG/ACT inhaler, Inhale 2 puffs into the lungs 2 (two) times daily., Disp: , Rfl:  .  budesonide-formoterol (SYMBICORT) 160-4.5 MCG/ACT inhaler, Inhale into the lungs., Disp: , Rfl:  .  Cholecalciferol (VITAMIN D PO), Take by mouth., Disp: , Rfl:  .  dexlansoprazole (DEXILANT) 60 MG capsule, Take 60 mg by mouth daily. Reported on 05/05/2015, Disp: , Rfl:  .  fluticasone furoate-vilanterol (BREO ELLIPTA) 100-25 MCG/INH AEPB, Inhale 1 puff into the lungs daily., Disp: , Rfl:  .  guaiFENesin (MUCINEX) 600 MG 12 hr tablet, Take by mouth 2 (two) times daily as needed. Reported on 05/05/2015, Disp: , Rfl:  .  guaiFENesin-codeine 100-10 MG/5ML syrup, 5-10 ml po q 8 hours prn cough, Disp: 120 mL, Rfl: 0 .  loratadine (CLARITIN) 10 MG tablet, Take 10 mg by mouth daily. AM, Disp: , Rfl:  .  montelukast (SINGULAIR) 10 MG tablet, Take 10 mg by mouth at bedtime., Disp: , Rfl:  .  predniSONE (DELTASONE) 50  MG tablet, Take 1 tablet (50 mg total) by mouth daily with breakfast for 5 days., Disp: 5 tablet, Rfl: 0 .  ranitidine (ZANTAC) 150 MG tablet, Take 150 mg by mouth 2 (two) times daily., Disp: , Rfl:  .  Spacer/Aero-Holding Chambers (AEROCHAMBER PLUS) inhaler, Use as instructed, Disp: 1 each, Rfl: 2  Allergies  Allergen Reactions  . Peanuts [Peanut Oil] Swelling     ROS  As noted in HPI.   Physical Exam  BP 128/72 (BP Location: Left Arm)   Pulse 95   Temp 98.4 F (36.9 C) (Oral)   Resp 20   Ht 5\' 9"  (1.753 m)   Wt 206 lb (93.4 kg)    SpO2 94%   BMI 30.42 kg/m   Constitutional: Well developed, well nourished, no acute distress.  Speaking full sentences.  No respiratory distress. Eyes:  EOMI, conjunctiva normal bilaterally HENT: Normocephalic, atraumatic,mucus membranes moist positive nasal congestion.  No sinus tenderness. Respiratory: Normal inspiratory effort in left side.  Rhonchi left lower lobe. Cardiovascular: Normal rate regular rhythm no murmurs rubs or gallops  GI: nondistended skin: No rash, skin intact Musculoskeletal: no deformities Neurologic: Alert & oriented x 3, no focal neuro deficits Psychiatric: Speech and behavior appropriate   ED Course   Medications - No data to display  Orders Placed This Encounter  Procedures  . DG Chest 2 View    Standing Status:   Standing    Number of Occurrences:   1    Order Specific Question:   Reason for Exam (SYMPTOM  OR DIAGNOSIS REQUIRED)    Answer:   cough, low sats    No results found for this or any previous visit (from the past 24 hour(s)). Dg Chest 2 View  Result Date: 06/22/2017 CLINICAL DATA:  Cough. Congestion. Flu since Monday. History of asthma. EXAM: CHEST - 2 VIEW COMPARISON:  1/1/6 pain FINDINGS: Midline trachea. Normal heart size and mediastinal contours. No pleural effusion or pneumothorax. Posterior opacity on the lateral view may be within the medial left lower lobe on the frontal. IMPRESSION: Retrocardiac opacity on the lateral view, possibly in the medial left lower lobe. Favored to represent pneumonia. Followup PA and lateral chest X-ray is recommended in 3-4 weeks following trial of antibiotic therapy to ensure resolution and exclude underlying malignancy. Electronically Signed   By: Abigail Miyamoto M.D.   On: 06/22/2017 09:01    ED Clinical Impression  Community acquired pneumonia of left lower lobe of lung [facility])   ED Assessment/Plan  Previous records reviewed.  As noted in HPI. O2 sat 93%, previous O2 sat 99% when seen on  Monday.  Reviewed imaging independently.  Medial left lower lobe pneumonia.  See radiology report for full details.  Repeat O2 sats post DuoNeb No. 1 92%.  Giving another.  Post DuoNeb No. 2, patient states he feels better, repeat O2 sat 94%.  Plan to send home with azithromycin 500 mg for 5 days because of the history of asthma, and albuterol inhaler with a spacer.  2 puffs every 4 hours, back off on this as he starts to feel better.  We will also send home on 50 mg of prednisone for 5 days because of the history of asthma.  Follow-up with PMD in 5 days, to the ER if he gets worse.  Feel that the patient is stable to try outpatient treatment first. Discussed imaging, MDM, plan and followup with patient. Discussed sn/sx that should prompt return to the ED. patient agrees  with plan.   Meds ordered this encounter  Medications  . DISCONTD: ipratropium-albuterol (DUONEB) 0.5-2.5 (3) MG/3ML nebulizer solution 3 mL  . albuterol (PROAIR HFA) 108 (90 Base) MCG/ACT inhaler    Sig: Inhale 2 puffs into the lungs every 4 (four) hours as needed for wheezing or shortness of breath.    Dispense:  1 Inhaler    Refill:  0  . azithromycin (ZITHROMAX) 500 MG tablet    Sig: Take 1 tablet (500 mg total) by mouth daily for 5 days.    Dispense:  5 tablet    Refill:  0  . predniSONE (DELTASONE) 50 MG tablet    Sig: Take 1 tablet (50 mg total) by mouth daily with breakfast for 5 days.    Dispense:  5 tablet    Refill:  0  . Spacer/Aero-Holding Chambers (AEROCHAMBER PLUS) inhaler    Sig: Use as instructed    Dispense:  1 each    Refill:  2    *This clinic note was created using Dragon dictation software. Therefore, there may be occasional mistakes despite careful proofreading.   ?   Melynda Ripple, MD 06/23/17 [phone]

## 2017-06-22 NOTE — Discharge Instructions (Signed)
2 puffs from your albuterol inhaler every 4 hours.  Back off to every 6 hours as you start to feel better.  Finish the antibiotics and the steroids.  Go to the ER for fevers above 100.4 after being on the antibiotics for 36-48 hours, for shortness of breath not responding to the albuterol inhaler, if you are needing the albuterol inhaler more than once an hour, or for other concerns.

## 2018-05-21 ENCOUNTER — Other Ambulatory Visit: Payer: Self-pay | Admitting: Family Medicine

## 2018-05-21 DIAGNOSIS — N200 Calculus of kidney: Secondary | ICD-10-CM

## 2018-06-19 ENCOUNTER — Encounter (INDEPENDENT_AMBULATORY_CARE_PROVIDER_SITE_OTHER): Payer: Self-pay

## 2018-06-19 ENCOUNTER — Ambulatory Visit
Admission: RE | Admit: 2018-06-19 | Discharge: 2018-06-19 | Disposition: A | Discharge status: Home / Self Care | Payer: BLUE CROSS/BLUE SHIELD | Source: Ambulatory Visit | Attending: Family Medicine | Admitting: Family Medicine

## 2018-06-19 DIAGNOSIS — N200 Calculus of kidney: Secondary | ICD-10-CM | POA: Diagnosis not present

## 2018-06-19 MED ORDER — IOHEXOL 300 MG/ML  SOLN
100.0000 mL | Freq: Once | INTRAMUSCULAR | Status: AC | PRN
  Administered 2018-06-19: 100 mL via INTRAVENOUS

## 2019-03-10 ENCOUNTER — Other Ambulatory Visit: Payer: Self-pay | Admitting: Gerontology

## 2019-03-10 DIAGNOSIS — R1032 Left lower quadrant pain: Secondary | ICD-10-CM

## 2019-03-10 DIAGNOSIS — R399 Unspecified symptoms and signs involving the genitourinary system: Secondary | ICD-10-CM

## 2019-03-23 ENCOUNTER — Other Ambulatory Visit: Payer: Self-pay

## 2019-03-23 ENCOUNTER — Ambulatory Visit
Admission: RE | Admit: 2019-03-23 | Discharge: 2019-03-23 | Disposition: A | Discharge status: Home / Self Care | Payer: BC Managed Care – PPO | Source: Ambulatory Visit | Attending: Gerontology | Admitting: Gerontology

## 2019-03-23 ENCOUNTER — Encounter (INDEPENDENT_AMBULATORY_CARE_PROVIDER_SITE_OTHER): Payer: Self-pay

## 2019-03-23 DIAGNOSIS — R399 Unspecified symptoms and signs involving the genitourinary system: Secondary | ICD-10-CM | POA: Diagnosis not present

## 2019-03-23 DIAGNOSIS — R1032 Left lower quadrant pain: Secondary | ICD-10-CM | POA: Diagnosis present

## 2019-03-23 MED ORDER — IOHEXOL 300 MG/ML  SOLN
100.0000 mL | Freq: Once | INTRAMUSCULAR | Status: AC | PRN
  Administered 2019-03-23: 100 mL via INTRAVENOUS

## 2019-04-13 ENCOUNTER — Other Ambulatory Visit: Payer: Self-pay

## 2019-04-13 ENCOUNTER — Other Ambulatory Visit: Payer: Self-pay | Admitting: *Deleted

## 2019-04-13 ENCOUNTER — Ambulatory Visit (INDEPENDENT_AMBULATORY_CARE_PROVIDER_SITE_OTHER): Payer: BC Managed Care – PPO | Admitting: Urology

## 2019-04-13 ENCOUNTER — Other Ambulatory Visit
Admission: RE | Admit: 2019-04-13 | Discharge: 2019-04-13 | Disposition: A | Discharge status: Home / Self Care | Payer: BC Managed Care – PPO | Attending: Urology | Admitting: Urology

## 2019-04-13 ENCOUNTER — Encounter: Payer: Self-pay | Admitting: Urology

## 2019-04-13 VITALS — BP 145/87 | HR 66 | Ht 69.0 in | Wt 206.0 lb

## 2019-04-13 DIAGNOSIS — R300 Dysuria: Secondary | ICD-10-CM | POA: Diagnosis present

## 2019-04-13 DIAGNOSIS — N200 Calculus of kidney: Secondary | ICD-10-CM

## 2019-04-13 LAB — URINALYSIS, COMPLETE (UACMP) WITH MICROSCOPIC
Bilirubin Urine: NEGATIVE
Glucose, UA: NEGATIVE mg/dL
Hgb urine dipstick: NEGATIVE
Ketones, ur: NEGATIVE mg/dL
Leukocytes,Ua: NEGATIVE
Nitrite: NEGATIVE
Protein, ur: NEGATIVE mg/dL
Specific Gravity, Urine: 1.025 (ref 1.005–1.030)
pH: 6.5 (ref 5.0–8.0)

## 2019-04-13 MED ORDER — SULFAMETHOXAZOLE-TRIMETHOPRIM 800-160 MG PO TABS: 1.0000 | ORAL_TABLET | Freq: Two times a day (BID) | ORAL | 0 refills | Status: DC

## 2019-04-13 NOTE — Addendum Note (Signed)
Addended by: Billey Co on: 04/13/2019 11:12 AM   Modules accepted: Orders

## 2019-04-13 NOTE — Progress Notes (Signed)
04/13/19 10:42 AM   Ryan Miller 18-Jul-1960 [id number]  Referring provider: Medicine, [facility] [address] [place],  [place] [address]  CC: Dysuria  HPI: I saw Ryan Miller today in consultation for dysuria.  He is a healthy 58 year old male who has been on Flomax since March 2020 for some mild urinary symptoms he reports 3 months of mild occasional dysuria with urination.  He also reports an abnormal sensation when he voids in his penis.  He denies any symptoms of weak stream, urgency, frequency, incontinence, or hematuria.  He denies any history of UTI or STDs.  He denies any new sexual partners or high risk sexual practices.  He also has some low left back pain at baseline.  He has a history of multiple spontaneously passed kidney stones.  Recent imaging on 03/23/2019 with CT abdomen pelvis with contrast showed some very small upper pole non-obstructing right-sided stones, but no hydronephrosis or abnormalities on the left side.  The bladder and prostate was grossly normal.  Last PSA on 03/09/2019 was normal at 0.69.  There are no aggravating or alleviating factors.  He is a never smoker and denies any other carcinogenic exposures.   PMH: Past Medical History:  Diagnosis Date  . Asthma   . Concussion 08/01/2014  . Dysrhythmia    "Rapid whens leans to side"- Neg Stress 5-10 yrrs ago  . GERD (gastroesophageal reflux disease)   . Motion sickness   . Neuromuscular disorder (HCC)    L arm numbness - bulging discs    Surgical History: Past Surgical History:  Procedure Laterality Date  . COLONOSCOPY N/A 08/19/2014   Procedure: COLONOSCOPY;  Surgeon: Lucilla Lame, MD;  Location: [place];  Service: Gastroenterology;  Laterality: N/A;  . ESOPHAGOGASTRODUODENOSCOPY N/A 08/19/2014   Procedure: ESOPHAGOGASTRODUODENOSCOPY (EGD);  Surgeon: Lucilla Lame, MD;  Location: [place];  Service: Gastroenterology;  Laterality: N/A;  . NASAL SINUS SURGERY    .  TONSILLECTOMY      Allergies:  Allergies  Allergen Reactions  . Peanuts [Peanut Oil] Swelling    Family History: No family history on file.  Social History:  reports that he has never smoked. He has never used smokeless tobacco. He reports that he does not drink alcohol or use drugs.  ROS: Please see flowsheet from today's date for complete review of systems.  Physical Exam: BP (!) 145/87   Pulse 66   Ht 5\' 9"  (1.753 m)   Wt 206 lb (93.4 kg)   BMI 30.42 kg/m    Constitutional:  Alert and oriented, No acute distress. Cardiovascular: No clubbing, cyanosis, or edema. Respiratory: Normal respiratory effort, no increased work of breathing. GI: Abdomen is soft, nontender, nondistended, no abdominal masses GU: No CVA tenderness.  Circumcised phallus with widely patent meatus.  Testicles 20 cc and descended bilaterally, no lesions Lymph: No cervical or inguinal lymphadenopathy. Skin: No rashes, bruises or suspicious lesions. Neurologic: Grossly intact, no focal deficits, moving all 4 extremities. Psychiatric: Normal mood and affect.  Laboratory Data: Reviewed  Urinalysis today nitrite negative, 0-5 WBCs, 0-5 RBCs, few bacteria  Pertinent Imaging: Reviewed, see HPI  Assessment & Plan:   In summary, the patient is a healthy 58 year old male with 3 months of mild dysuria of unclear etiology.  He does not have any risk factors for urothelial cell carcinoma/CIS.  He does not have any difficulty voiding aside from the mild dysuria.  Urinalysis today is relatively benign aside for a few bacteria.  We discussed  the complex etiologies of dysuria and possibilities including infection, inflammation, malignancy, or obstruction.  Trial of Bactrim DS twice daily x10 days for a possible mild prostatitis Will send urine for culture Consider NSAIDs or Pyridium in the future for pain or worsening symptoms Consider cystoscopy in the future if not improved Stone prevention strategies discussed  at length RTC PRN  A total of 40 minutes were spent face-to-face with the patient, greater than 50% was spent in patient education, counseling, and coordination of care regarding dysuria and stone prevention.   Billey Co, [facility] [address], [place], [place] [address] [phone]

## 2019-04-13 NOTE — Patient Instructions (Signed)
1. OK to try 1-2 weeks of anti-inflammatories like aleve or ibuprofen for the burning 2. Consider stopping flomax for 1-2 weeks to see if burning/discomfort resolves 3. Try pyridium OTC if worsening symptoms 4. Call if symptoms worsen despite above strategies and antibiotics   Dietary Guidelines to Help Prevent Kidney Stones Kidney stones are deposits of minerals and salts that form inside your kidneys. Your risk of developing kidney stones may be greater depending on your diet, your lifestyle, the medicines you take, and whether you have certain medical conditions. Most people can reduce their chances of developing kidney stones by following the instructions below. Depending on your overall health and the type of kidney stones you tend to develop, your dietitian may give you more specific instructions. What are tips for following this plan? Reading food labels  Choose foods with "no salt added" or "low-salt" labels. Limit your sodium intake to less than 1500 mg per day.  Choose foods with calcium for each meal and snack. Try to eat about 300 mg of calcium at each meal. Foods that contain 200-500 mg of calcium per serving include: ? 8 oz (237 ml) of milk, fortified nondairy milk, and fortified fruit juice. ? 8 oz (237 ml) of kefir, yogurt, and soy yogurt. ? 4 oz (118 ml) of tofu. ? 1 oz of cheese. ? 1 cup (300 g) of dried figs. ? 1 cup (91 g) of cooked broccoli. ? 1-3 oz can of sardines or mackerel.  Most people need 1000 to 1500 mg of calcium each day. Talk to your dietitian about how much calcium is recommended for you. Shopping  Buy plenty of fresh fruits and vegetables. Most people do not need to avoid fruits and vegetables, even if they contain nutrients that may contribute to kidney stones.  When shopping for convenience foods, choose: ? Whole pieces of fruit. ? Premade salads with dressing on the side. ? Low-fat fruit and yogurt smoothies.  Avoid buying frozen meals or prepared  deli foods.  Look for foods with live cultures, such as yogurt and kefir. Cooking  Do not add salt to food when cooking. Place a salt shaker on the table and allow each person to add his or her own salt to taste.  Use vegetable protein, such as beans, textured vegetable protein (TVP), or tofu instead of meat in pasta, casseroles, and soups. Meal planning   Eat less salt, if told by your dietitian. To do this: ? Avoid eating processed or premade food. ? Avoid eating fast food.  Eat less animal protein, including cheese, meat, poultry, or fish, if told by your dietitian. To do this: ? Limit the number of times you have meat, poultry, fish, or cheese each week. Eat a diet free of meat at least 2 days a week. ? Eat only one serving each day of meat, poultry, fish, or seafood. ? When you prepare animal protein, cut pieces into small portion sizes. For most meat and fish, one serving is about the size of one deck of cards.  Eat at least 5 servings of fresh fruits and vegetables each day. To do this: ? Keep fruits and vegetables on hand for snacks. ? Eat 1 piece of fruit or a handful of berries with breakfast. ? Have a salad and fruit at lunch. ? Have two kinds of vegetables at dinner.  Limit foods that are high in a substance called oxalate. These include: ? Spinach. ? Rhubarb. ? Beets. ? Potato chips and french fries. ?  Nuts.  If you regularly take a diuretic medicine, make sure to eat at least 1-2 fruits or vegetables high in potassium each day. These include: ? Avocado. ? Banana. ? Orange, prune, carrot, or tomato juice. ? Baked potato. ? Cabbage. ? Beans and split peas. General instructions   Drink enough fluid to keep your urine clear or pale yellow. This is the most important thing you can do.  Talk to your health care provider and dietitian about taking daily supplements. Depending on your health and the cause of your kidney stones, you may be advised: ? Not to take  supplements with vitamin C. ? To take a calcium supplement. ? To take a daily probiotic supplement. ? To take other supplements such as magnesium, fish oil, or vitamin B6.  Take all medicines and supplements as told by your health care provider.  Limit alcohol intake to no more than 1 drink a day for nonpregnant women and 2 drinks a day for men. One drink equals 12 oz of beer, 5 oz of wine, or 1 oz of hard liquor.  Lose weight if told by your health care provider. Work with your dietitian to find strategies and an eating plan that works best for you. What foods are not recommended? Limit your intake of the following foods, or as told by your dietitian. Talk to your dietitian about specific foods you should avoid based on the type of kidney stones and your overall health. Grains Breads. Bagels. Rolls. Baked goods. Salted crackers. Cereal. Pasta. Vegetables Spinach. Rhubarb. Beets. Canned vegetables. Angie Fava. Olives. Meats and other protein foods Nuts. Nut butters. Large portions of meat, poultry, or fish. Salted or cured meats. Deli meats. Hot dogs. Sausages. Dairy Cheese. Beverages Regular soft drinks. Regular vegetable juice. Seasonings and other foods Seasoning blends with salt. Salad dressings. Canned soups. Soy sauce. Ketchup. Barbecue sauce. Canned pasta sauce. Casseroles. Pizza. Lasagna. Frozen meals. Potato chips. [place] fries. Summary  You can reduce your risk of kidney stones by making changes to your diet.  The most important thing you can do is drink enough fluid. You should drink enough fluid to keep your urine clear or pale yellow.  Ask your health care provider or dietitian how much protein from animal sources you should eat each day, and also how much salt and calcium you should have each day. This information is not intended to replace advice given to you by your health care provider. Make sure you discuss any questions you have with your health care  provider. Document Released: 07/27/2010 Document Revised: 07/22/2018 Document Reviewed: 03/12/2016 Elsevier Patient Education  2020 [employer].

## 2019-04-14 LAB — URINE CULTURE: Culture: NO GROWTH

## 2019-05-11 ENCOUNTER — Other Ambulatory Visit: Payer: Self-pay

## 2019-05-27 ENCOUNTER — Ambulatory Visit: Payer: BC Managed Care – PPO | Admitting: Gastroenterology

## 2019-06-11 ENCOUNTER — Ambulatory Visit: Payer: BC Managed Care – PPO | Admitting: Gastroenterology

## 2019-06-16 ENCOUNTER — Encounter: Payer: Self-pay | Admitting: Gastroenterology

## 2019-06-16 ENCOUNTER — Ambulatory Visit (INDEPENDENT_AMBULATORY_CARE_PROVIDER_SITE_OTHER): Payer: BC Managed Care – PPO | Admitting: Gastroenterology

## 2019-06-16 ENCOUNTER — Other Ambulatory Visit: Payer: Self-pay

## 2019-06-16 VITALS — BP 120/77 | HR 70 | Temp 97.3°F | Ht 69.0 in | Wt 226.2 lb

## 2019-06-16 DIAGNOSIS — K610 Anal abscess: Secondary | ICD-10-CM

## 2019-06-16 DIAGNOSIS — K921 Melena: Secondary | ICD-10-CM

## 2019-06-16 NOTE — Progress Notes (Signed)
Gastroenterology Consultation  Referring Provider:     Sofie Hartigan, MD Primary Care Physician:  Sofie Hartigan, MD Primary Gastroenterologist:  Dr. Allen Norris     Reason for Consultation:     Questionable perianal abscess        HPI:   Ryan Miller is a 59 y.o. y/o male referred for consultation & management of questionable perianal abscess by Dr. Ellison Hughs, Chrissie Noa, MD.  This patient comes to see me today after having an EGD and colonoscopy by me back in 2016.  At that time the patient was found to have a hyperplastic polyp and no other colonic abnormalities.  The patient was seen by his primary care provider and had reported:   "He states on his left side of the perianal area he will have a swelling and tenderness for a day or 2 that will eventually rupture and will have bright red blood"  On physical exam it was thought that the patient may have a perianal abscess and was sent to me for evaluation. The patient did have an episode of diarrhea at the end of the year but he states he took some Imodium and has not had any problems with that.  He does report that he has intermittent rectal bleeding when he gets constipated.  Past Medical History:  Diagnosis Date  . Asthma   . Concussion 08/01/2014  . Dysrhythmia    "Rapid whens leans to side"- Neg Stress 5-10 yrrs ago  . GERD (gastroesophageal reflux disease)   . Motion sickness   . Neuromuscular disorder (HCC)    L arm numbness - bulging discs    Past Surgical History:  Procedure Laterality Date  . COLONOSCOPY N/A 08/19/2014   Procedure: COLONOSCOPY;  Surgeon: Lucilla Lame, MD;  Location: [facility];  Service: Gastroenterology;  Laterality: N/A;  . ESOPHAGOGASTRODUODENOSCOPY N/A 08/19/2014   Procedure: ESOPHAGOGASTRODUODENOSCOPY (EGD);  Surgeon: Lucilla Lame, MD;  Location: [place];  Service: Gastroenterology;  Laterality: N/A;  . NASAL SINUS SURGERY    . TONSILLECTOMY      Prior to Admission  medications   Medication Sig Start Date End Date Taking? Authorizing Provider  albuterol (PROAIR HFA) 108 (90 Base) MCG/ACT inhaler Inhale 2 puffs into the lungs every 4 (four) hours as needed for wheezing or shortness of breath. 06/22/17   Melynda Ripple, MD  azelastine (ASTELIN) 0.1 % nasal spray Place into both nostrils daily. Use in each nostril as directed    [provider]  Cholecalciferol (VITAMIN D PO) Take by mouth.    [provider]  fluticasone furoate-vilanterol (BREO ELLIPTA) 100-25 MCG/INH AEPB Inhale 1 puff into the lungs daily.    [provider]  loratadine (CLARITIN) 10 MG tablet Take 10 mg by mouth daily. AM    [provider]  montelukast (SINGULAIR) 10 MG tablet Take 10 mg by mouth at bedtime.    [provider]  omeprazole (PRILOSEC) 20 MG capsule Take 20 mg by mouth daily.    [provider]  Spacer/Aero-Holding Chambers (AEROCHAMBER PLUS) inhaler Use as instructed 06/22/17   Melynda Ripple, MD  sulfamethoxazole-trimethoprim (BACTRIM DS) 800-160 MG tablet Take 1 tablet by mouth 2 (two) times daily. 04/13/19   Billey Co, MD  tamsulosin (FLOMAX) 0.4 MG CAPS capsule TAKE ONE CAPSULE BY MOUTH ONCE DAILY, 30 MINUTES AFTER SAME MEAL EACH DAY 01/01/19   [provider]    No family history on file.   Social History  Tobacco Use  . Smoking status: Never Smoker  . Smokeless tobacco: Never Used  Substance Use Topics  . Alcohol use: No  . Drug use: No    Allergies as of 06/16/2019 - Review Complete 04/13/2019  Allergen Reaction Noted  . Peanuts [peanut oil] Swelling 08/15/2014    Review of Systems:    All systems reviewed and negative except where noted in HPI.   Physical Exam:  There were no vitals taken for this visit. No LMP for male patient. General:   Alert,  Well-developed, well-nourished, pleasant and cooperative in NAD Head:  Normocephalic and atraumatic. Eyes:  Sclera clear, no  icterus.   Conjunctiva pink. Ears:  Normal auditory acuity. Neck:  Supple; no masses or thyromegaly. Abdomen:  Normal bowel sounds.  No bruits.  Soft, non-tender and non-distended without masses, hepatosplenomegaly or hernias noted.  No guarding or rebound tenderness.  Negative Carnett sign.   Rectal: Positive orifice on the left buttocks approximately 2 cm from the anus.  There is no erythema or fluctuation.  Pulses:  Normal pulses noted. Extremities:  No clubbing or edema.  No cyanosis. Neurologic:  Alert and oriented x3;  grossly normal neurologically. Skin:  Intact without significant lesions or rashes.  No jaundice. Lymph Nodes:  No significant cervical adenopathy. Psych:  Alert and cooperative. Normal mood and affect.  Imaging Studies: No results found.  Assessment and Plan:   Ryan Miller is a 59 y.o. y/o male who comes in with a lesion on the left buttocks that appears to be a possible perianal abscess that appears to have drained with some firmness at the site of the orifice.  There is nothing abnormal on the rectal exam.  The patient will be set up with surgery for evaluation and possible evacuation of this lesion.  The patient has been explained the plan and agrees with it.    Lucilla Lame, MD. Marval Regal    Note: This dictation was prepared with Dragon dictation along with smaller phrase technology. Any transcriptional errors that result from this process are unintentional.

## 2019-06-16 NOTE — Addendum Note (Signed)
Addended byGlennie Isle E on: 06/16/2019 09:14 AM   Modules accepted: Orders

## 2019-06-21 ENCOUNTER — Ambulatory Visit: Payer: Self-pay | Admitting: Surgery

## 2019-06-30 ENCOUNTER — Encounter: Payer: Self-pay | Admitting: Surgery

## 2019-06-30 ENCOUNTER — Ambulatory Visit (INDEPENDENT_AMBULATORY_CARE_PROVIDER_SITE_OTHER): Payer: BC Managed Care – PPO | Admitting: Surgery

## 2019-06-30 ENCOUNTER — Other Ambulatory Visit: Payer: Self-pay

## 2019-06-30 VITALS — BP 143/86 | HR 73 | Temp 96.3°F | Ht 69.0 in | Wt 227.8 lb

## 2019-06-30 DIAGNOSIS — K610 Anal abscess: Secondary | ICD-10-CM

## 2019-06-30 MED ORDER — CIPROFLOXACIN HCL 500 MG PO TABS: 500.0000 mg | ORAL_TABLET | Freq: Two times a day (BID) | ORAL | 0 refills | Status: AC

## 2019-06-30 MED ORDER — METRONIDAZOLE 500 MG PO TABS: 500.0000 mg | ORAL_TABLET | Freq: Three times a day (TID) | ORAL | 0 refills | Status: AC

## 2019-06-30 NOTE — Patient Instructions (Addendum)
Dr.Pabon prescribed patient Antibiotic Treatment for two weeks. He sent Cipro and Flagyl to patient's local pharmacy.  Patient will follow up in three weeks with Dr.Pabon.

## 2019-06-30 NOTE — Progress Notes (Signed)
Patient ID: [name], male   DOB: 12/01/1960, 59 y.o.   MRN: [id number]  HPI DORIS HEAD is a 59 y.o. male seen in consultation at the request of Dr.Wohl. He recently reported that about 1 year ago he started having some perianal pain along with serosanguinous draiange.  Pain was mild in  intensity intermittent and worsened  when sitting. He feels that his perianal area swells intermittently. He has associated diarrhea and constipation periodically. Denies any fevers and chills no weight loss. No recent antibiotics. CT scan of the abdomen and pelvis personally reviewed showing no evidence of hernias or abscesses at that time.  He did have some kidney stones in the right side. He did have a colonoscopy in 2016 by Dr.Wohl revealing 2 small hyperplastic polyps.  No rectal lesions. CBC and CMP personally reviewed showing no abnormalities HPI  Past Medical History:  Diagnosis Date  . Asthma   . Concussion 08/01/2014  . Dysrhythmia    "Rapid whens leans to side"- Neg Stress 5-10 yrrs ago  . GERD (gastroesophageal reflux disease)   . Motion sickness   . Neuromuscular disorder (HCC)    L arm numbness - bulging discs    Past Surgical History:  Procedure Laterality Date  . COLONOSCOPY N/A 08/19/2014   Procedure: COLONOSCOPY;  Surgeon: Lucilla Lame, MD;  Location: [facility] [place];  Service: Gastroenterology;  Laterality: N/A;  . ESOPHAGOGASTRODUODENOSCOPY N/A 08/19/2014   Procedure: ESOPHAGOGASTRODUODENOSCOPY (EGD);  Surgeon: Lucilla Lame, MD;  Location: [place];  Service: Gastroenterology;  Laterality: N/A;  . NASAL SINUS SURGERY    . TONSILLECTOMY      No family history on file.  Social History Social History   Tobacco Use  . Smoking status: Never Smoker  . Smokeless tobacco: Never Used  Substance Use Topics  . Alcohol use: No  . Drug use: No    Allergies  Allergen Reactions  . Peanuts [Peanut Oil] Swelling    Current Outpatient Medications  Medication Sig  Dispense Refill  . albuterol (PROAIR HFA) 108 (90 Base) MCG/ACT inhaler Inhale 2 puffs into the lungs every 4 (four) hours as needed for wheezing or shortness of breath. 1 Inhaler 0  . azelastine (ASTELIN) 0.1 % nasal spray Place into both nostrils daily. Use in each nostril as directed    . Cholecalciferol (VITAMIN D PO) Take by mouth.    . fluticasone furoate-vilanterol (BREO ELLIPTA) 100-25 MCG/INH AEPB Inhale 1 puff into the lungs daily.    Marland Kitchen loratadine (CLARITIN) 10 MG tablet Take 10 mg by mouth daily. AM    . montelukast (SINGULAIR) 10 MG tablet Take 10 mg by mouth at bedtime.    Marland Kitchen omeprazole (PRILOSEC) 20 MG capsule Take 20 mg by mouth daily.    Marland Kitchen Spacer/Aero-Holding Chambers (AEROCHAMBER PLUS) inhaler Use as instructed 1 each 2  . tamsulosin (FLOMAX) 0.4 MG CAPS capsule TAKE ONE CAPSULE BY MOUTH ONCE DAILY, 30 MINUTES AFTER SAME MEAL EACH DAY     No current facility-administered medications for this visit.     Review of Systems Full ROS  was asked and was negative except for the information on the HPI  Physical Exam There were no vitals taken for this visit. CONSTITUTIONAL: NAD EYES: Pupils are equal, round, and reactive to light, Sclera are non-icteric. EARS, NOSE, MOUTH AND THROAT: He is wearing a mask. hearing is intact to voice. LYMPH NODES:  Lymph nodes in the neck are normal. RESPIRATORY:  Lungs are clear. There is normal  respiratory effort, with equal breath sounds bilaterally, and without pathologic use of accessory muscles. CARDIOVASCULAR: Heart is regular without murmurs, gallops, or rubs. GI: The abdomen is soft, nontender, and nondistended. There are no palpable masses. There is no hepatosplenomegaly. There are normal bowel sounds in all quadrants. Reducible umbilical hernia Rectal: On the left there is a small area of induration about 2 cm from the anal margin. There is no evidence of an abscess. There are no intraluminal lesions. No rectal lytic  lesions. MUSCULOSKELETAL: Normal muscle strength and tone. No cyanosis or edema.   SKIN: Turgor is good and there are no pathologic skin lesions or ulcers. NEUROLOGIC: Motor and sensation is grossly normal. Cranial nerves are grossly intact. PSYCH:  Oriented to person, place and time. Affect is normal.  Data Reviewed  I have personally reviewed the patient's imaging, laboratory findings and medical records.    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 I had an extensive discussion with the patient about his disease process. He has never had any antibiotic therapy. Discussed with him the options of antibiotic course to try to eliminate the foci of infection ( cipro/flagyl) and if this does not get better probably proceed to the operating room for exam under anesthesia and fistulotomy. He in the meantime will do sitz bath's and high-fiber diet. There is no evidence of an abscess and there is no need for emergent surgical intervention. We'll see him back in about 3 weeks A copy of this report was sent to the referring provider   Caroleen Hamman, MD FACS General Surgeon 06/30/2019, 9:05 AM

## 2019-07-09 ENCOUNTER — Telehealth: Payer: Self-pay | Admitting: Surgery

## 2019-07-09 NOTE — Telephone Encounter (Signed)
Patient is calling about his antibiotic couldn't remember the name of which one it was, pt c/o having tingling in his legs behind calves down to the top of his heel patient is asking if something else could be called into the pharmacy for him. Please call patient and advise.

## 2019-07-09 NOTE — Telephone Encounter (Signed)
Patient states he has at least a week of Cipro and Flagyl left. He states he has a torn calf muscle and has tingling from this . He read the side affects and noted that the medicine can cause tingling too.     Patient wanted to let us know. He states he has improved and is not having any problems with taking the medication but didn't know if he should stop . At the end of the conversation he stated he wanted to continue taking and that he would call office with any issues.

## 2019-07-21 ENCOUNTER — Encounter: Payer: Self-pay | Admitting: Surgery

## 2019-07-21 ENCOUNTER — Other Ambulatory Visit: Payer: Self-pay

## 2019-07-21 ENCOUNTER — Ambulatory Visit (INDEPENDENT_AMBULATORY_CARE_PROVIDER_SITE_OTHER): Payer: BC Managed Care – PPO | Admitting: Surgery

## 2019-07-21 VITALS — BP 147/80 | HR 81 | Temp 97.9°F | Resp 15 | Ht 69.0 in | Wt 226.0 lb

## 2019-07-21 DIAGNOSIS — K603 Anal fistula: Secondary | ICD-10-CM | POA: Diagnosis not present

## 2019-07-21 NOTE — Patient Instructions (Addendum)
As discussed with Dr Dahlia Byes continue to monitor the area. Keeping it clean, you may use flushable wipes. You may also try sitz baths or taking the shower head and cleaning the area good.   We will call you around June 2021 to schedule your follow up in July 2021. Please call the office if you have any questions or concerns.

## 2019-07-23 NOTE — Progress Notes (Signed)
  Surgical Consultation  07/23/2019  Ryan Miller is an 59 y.o. male.   Chief Complaint  Patient presents with  . Follow-up    Peri Anal abscess      HPI: Ryan Miller is a 59 year old male with a previous perianal abscess that developed a small fistula now doing much better.  No fevers no chills.  Some discomfort but no pain.  No other concerns at this time  Past Medical History:  Diagnosis Date  . Asthma   . Concussion 08/01/2014  . Dysrhythmia    "Rapid whens leans to side"- Neg Stress 5-10 yrrs ago  . GERD (gastroesophageal reflux disease)   . Motion sickness   . Neuromuscular disorder (HCC)    L arm numbness - bulging discs    Past Surgical History:  Procedure Laterality Date  . COLONOSCOPY N/A 08/19/2014   Procedure: COLONOSCOPY;  Surgeon: Lucilla Lame, MD;  Location: [place];  Service: Gastroenterology;  Laterality: N/A;  . ESOPHAGOGASTRODUODENOSCOPY N/A 08/19/2014   Procedure: ESOPHAGOGASTRODUODENOSCOPY (EGD);  Surgeon: Lucilla Lame, MD;  Location: [place];  Service: Gastroenterology;  Laterality: N/A;  . NASAL SINUS SURGERY    . TONSILLECTOMY      No family history on file.  Social History:  reports that he has never smoked. He has never used smokeless tobacco. He reports that he does not drink alcohol or use drugs.  Allergies:  Allergies  Allergen Reactions  . Peanuts [Peanut Oil] Swelling    Medications reviewed.     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  There is a very minimal vestige of an abscess.  There is no evidence of a track right now.  No need for  surgical intervention at this time.  May follow-up in a few months  I spent around 50 minutes in the encounter with greater than 50% spent in coordination and counseling of his care  Caroleen Hamman, MD [facility] Surgeon

## 2019-09-20 ENCOUNTER — Ambulatory Visit: Payer: BC Managed Care – PPO | Admitting: Surgery

## 2019-10-20 ENCOUNTER — Ambulatory Visit: Payer: BC Managed Care – PPO | Admitting: Surgery

## 2019-10-25 ENCOUNTER — Other Ambulatory Visit: Payer: Self-pay

## 2019-10-25 ENCOUNTER — Encounter: Payer: Self-pay | Admitting: Surgery

## 2019-10-25 ENCOUNTER — Ambulatory Visit (INDEPENDENT_AMBULATORY_CARE_PROVIDER_SITE_OTHER): Payer: BC Managed Care – PPO | Admitting: Surgery

## 2019-10-25 VITALS — BP 147/78 | HR 73 | Temp 98.6°F | Resp 14 | Ht 68.0 in | Wt 228.2 lb

## 2019-10-25 DIAGNOSIS — K429 Umbilical hernia without obstruction or gangrene: Secondary | ICD-10-CM | POA: Diagnosis not present

## 2019-10-25 NOTE — Patient Instructions (Addendum)
Follow up as needed, call the office if you have any questions or concerns.  

## 2019-10-27 NOTE — Progress Notes (Signed)
Outpatient Surgical Follow Up  10/27/2019  Ryan Miller is an 59 y.o. male.   Chief Complaint  Patient presents with  . Follow-up    perianal abscess w/Dr. Dahlia Byes (06/30/19)    HPI: 59 year old male well-known to me with a history of anorectal drainage now comes in with a bulge within the abdominal wall.  He denies any previous major abdominal operations.  He did have a CT scan of the abdomen last year that have personally reviewed and I do see a small umbilical hernia but no other acute intra-abdominal abnormalities.  He reports that the bulge seems to be bigger over the last few months.  There is really no pain.  He also reports that he had one other episode of drainage from the anorectal area that subsided on its own.  No fevers no chills no anorectal pain.  Past Medical History:  Diagnosis Date  . Asthma   . Concussion 08/01/2014  . Dysrhythmia    "Rapid whens leans to side"- Neg Stress 5-10 yrrs ago  . GERD (gastroesophageal reflux disease)   . Motion sickness   . Neuromuscular disorder (HCC)    L arm numbness - bulging discs    Past Surgical History:  Procedure Laterality Date  . COLONOSCOPY N/A 08/19/2014   Procedure: COLONOSCOPY;  Surgeon: Ryan Lame, MD;  Location: [place];  Service: Gastroenterology;  Laterality: N/A;  . ESOPHAGOGASTRODUODENOSCOPY N/A 08/19/2014   Procedure: ESOPHAGOGASTRODUODENOSCOPY (EGD);  Surgeon: Ryan Lame, MD;  Location: [place];  Service: Gastroenterology;  Laterality: N/A;  . NASAL SINUS SURGERY    . TONSILLECTOMY      History reviewed. No pertinent family history.  Social History:  reports that he has never smoked. He has never used smokeless tobacco. He reports that he does not drink alcohol and does not use drugs.  Allergies:  Allergies  Allergen Reactions  . Peanuts [Peanut Oil] Swelling    Medications reviewed.    ROS Full ROS performed and is otherwise negative other than what is stated in HPI   BP  (!) 147/78   Pulse 73   Temp 98.6 F (37 C)   Resp 14   Ht 5\' 8"  (1.727 m)   Wt 228 lb 3.2 oz (103.5 kg)   SpO2 94%   BMI 34.70 kg/m   Physical Exam Vitals and nursing note reviewed.  Constitutional:      General: He is not in acute distress.    Appearance: Normal appearance. He is normal weight.  Pulmonary:     Effort: Pulmonary effort is normal. No respiratory distress.  Abdominal:     General: Abdomen is flat. There is no distension.     Palpations: Abdomen is soft. There is no mass.     Tenderness: There is no abdominal tenderness. There is no guarding or rebound.     Hernia: A hernia is present.     Comments: 1.5 cm umbilical hernia that is reducible.  There is evidence of diastases recti.  Musculoskeletal:        General: No swelling. Normal range of motion.  Skin:    General: Skin is warm and dry.     Capillary Refill: Capillary refill takes less than 2 seconds.  Neurological:     General: No focal deficit present.     Mental Status: He is alert and oriented to person, place, and time.  Psychiatric:        Mood and Affect: Mood normal.  Behavior: Behavior normal.        Thought Content: Thought content normal.        Judgment: Judgment normal.     Assessment/Plan: 59 year old male with umbilical hernia and diastases recti.  Currently not symptomatic.  Discussed with him in detail about his disease process.  Given the fact that he has no symptoms associated with his umbilical hernia necessarily think that this needs to be fixed.  Regarding his potential fistula is not affecting his daily activities and only had one episode of drainage there was minimal several months ago.  No need for surgical intervention regarding that issue either.  He understands that if this were to become symptomatic I will be recommending repair.  Instructions were given  Greater than 50% of the 25 minutes  visit was spent in counseling/coordination of care   Caroleen Hamman, MD  [facility] Sur[facility]

## 2020-06-07 ENCOUNTER — Other Ambulatory Visit: Payer: Self-pay | Admitting: Family Medicine

## 2020-06-07 ENCOUNTER — Other Ambulatory Visit (HOSPITAL_COMMUNITY): Payer: Self-pay | Admitting: Family Medicine

## 2020-06-07 DIAGNOSIS — R1011 Right upper quadrant pain: Secondary | ICD-10-CM

## 2020-06-26 ENCOUNTER — Ambulatory Visit
Admission: RE | Admit: 2020-06-26 | Discharge: 2020-06-26 | Disposition: A | Discharge status: Home / Self Care | Payer: BC Managed Care – PPO | Source: Ambulatory Visit | Attending: Family Medicine | Admitting: Family Medicine

## 2020-06-26 ENCOUNTER — Other Ambulatory Visit: Payer: Self-pay

## 2020-06-26 DIAGNOSIS — R1011 Right upper quadrant pain: Secondary | ICD-10-CM | POA: Insufficient documentation

## 2020-07-05 ENCOUNTER — Ambulatory Visit: Payer: BC Managed Care – PPO

## 2021-08-29 IMAGING — CT CT ABD-PELV W/ CM
1 of 2 series · 15 of 32 positions shown, 19 images · IV contrast (APPLIED)
Comparison: 06/20/2018 and 01/17/2015

CLINICAL DATA: Left lower quadrant pain and dysuria for 3 months.
Nephrolithiasis.

EXAM:
CT ABDOMEN AND PELVIS WITH CONTRAST
TECHNIQUE: Multidetector CT imaging of the abdomen and pelvis was performed
using the standard protocol following bolus administration of
intravenous contrast.
CONTRAST:  100mL OMNIPAQUE IOHEXOL 300 MG/ML  SOLN

[Series 2: axial st · axial · 0.98mm/px · z∈[-1015,-565]mm · 15 of 98 slices shown, 19 images]
[im 4/98  soft-tissue]
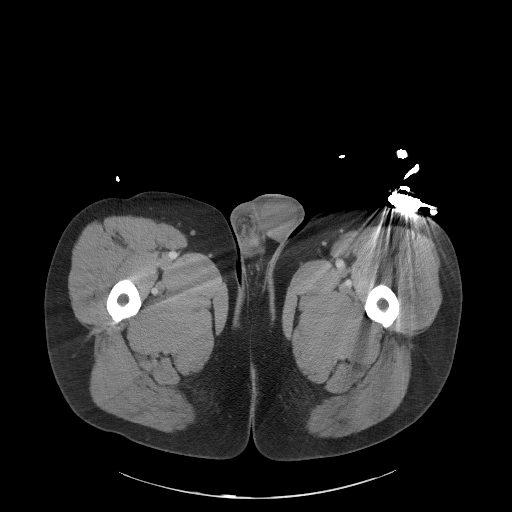
[im 4/98  bone]
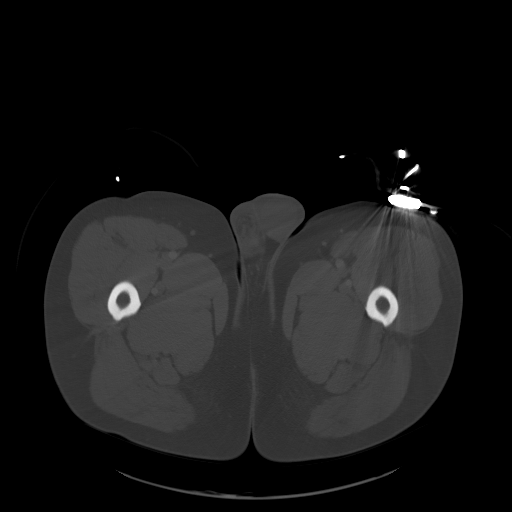
[im 12/98  soft-tissue]
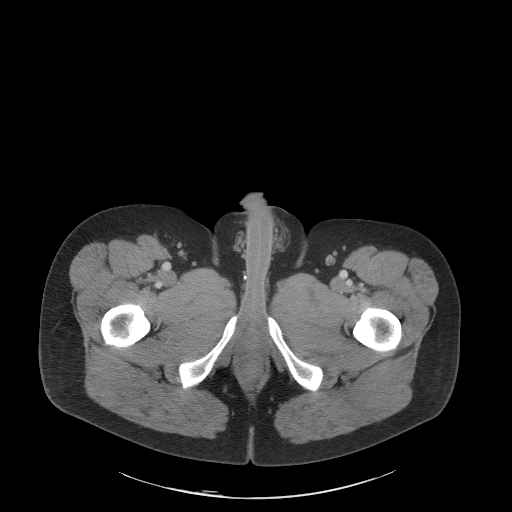
[im 20/98  soft-tissue]
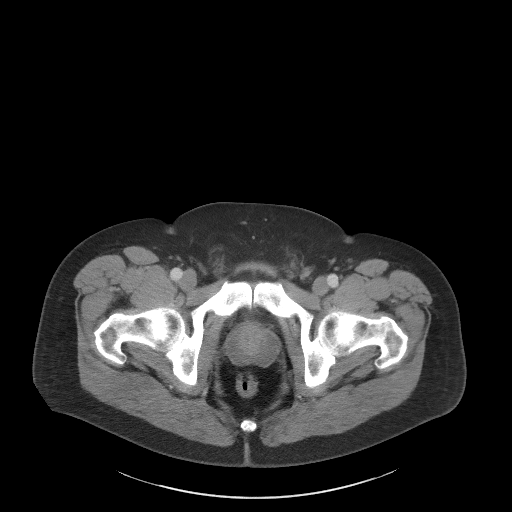
[im 28/98  soft-tissue]
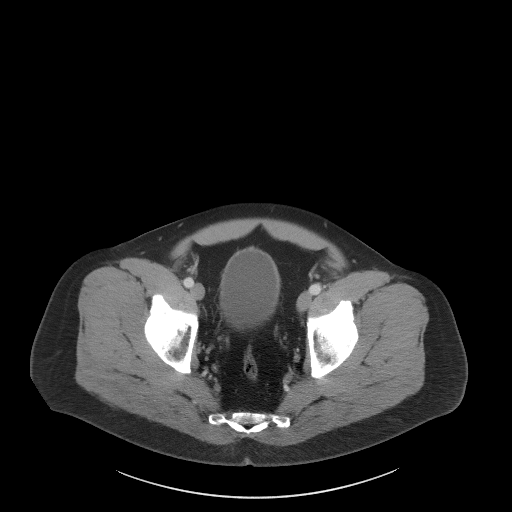
[im 35/98  soft-tissue]
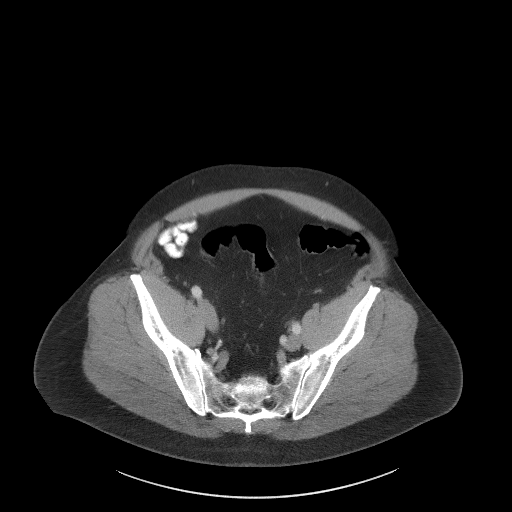
[im 43/98  soft-tissue]
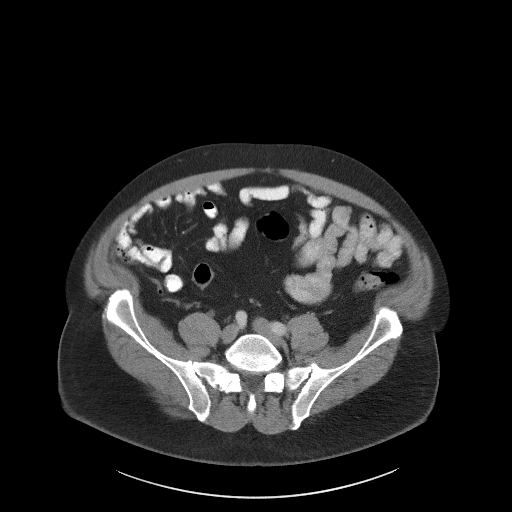
[im 51/98  soft-tissue]
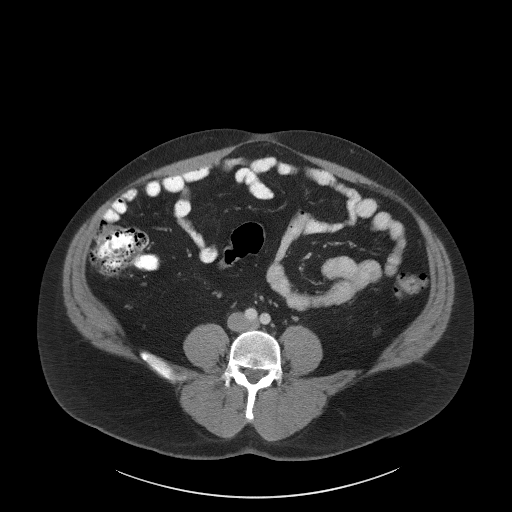
[im 55/98  soft-tissue]
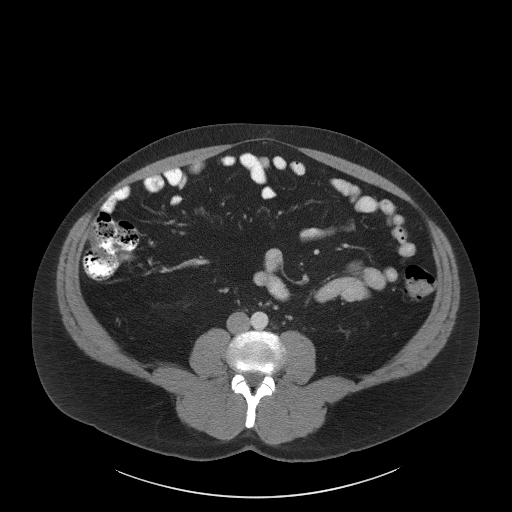
[im 63/98  soft-tissue]
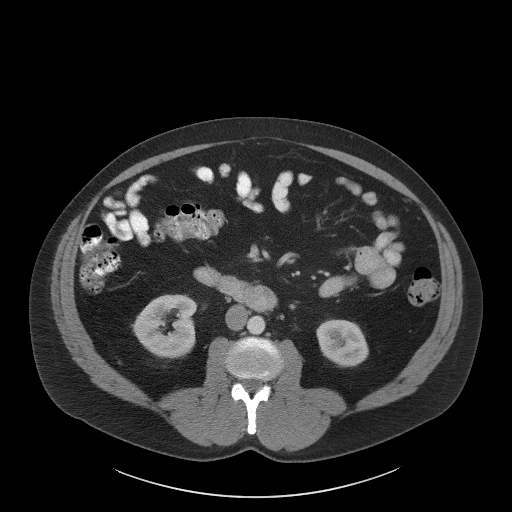
[im 63/98  bone]
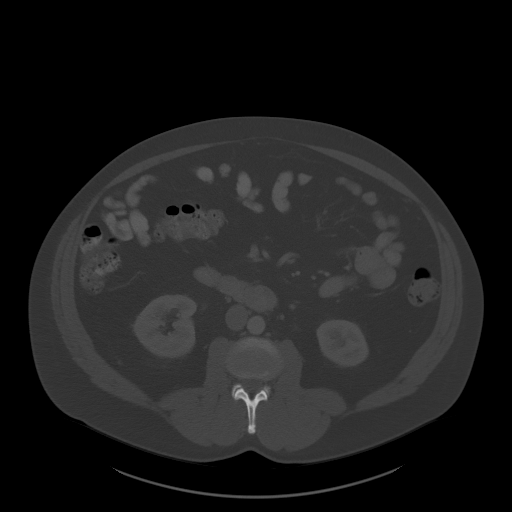
[im 70/98  soft-tissue]
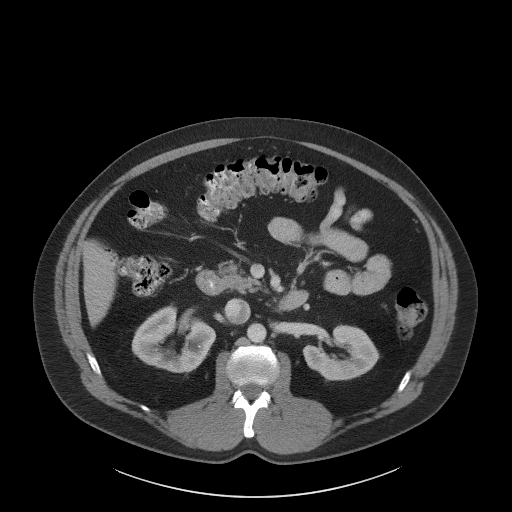
[im 78/98  soft-tissue]
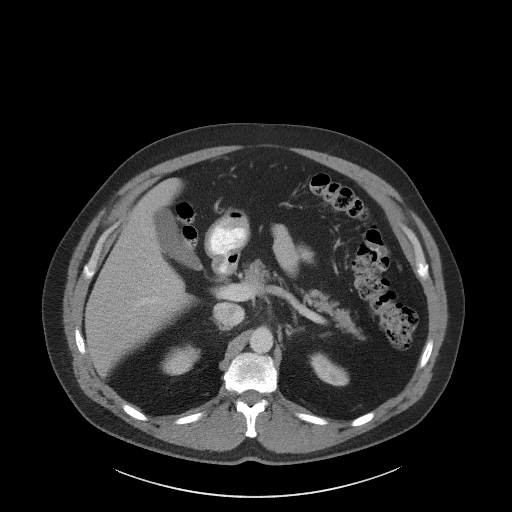
[im 82/98  lung]
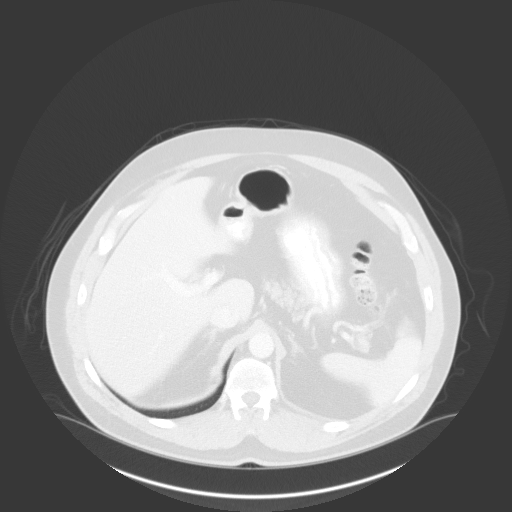
[im 86/98  soft-tissue]
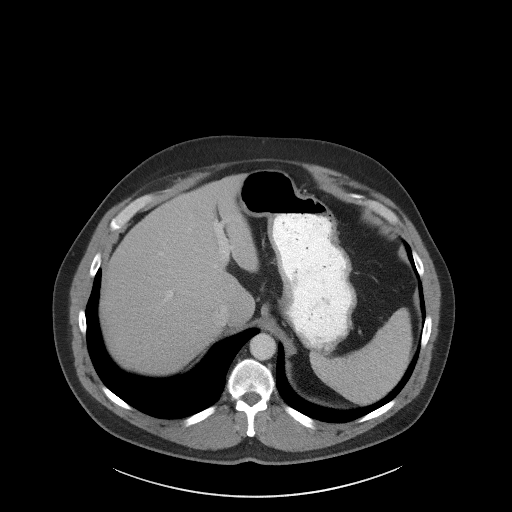
[im 86/98  lung]
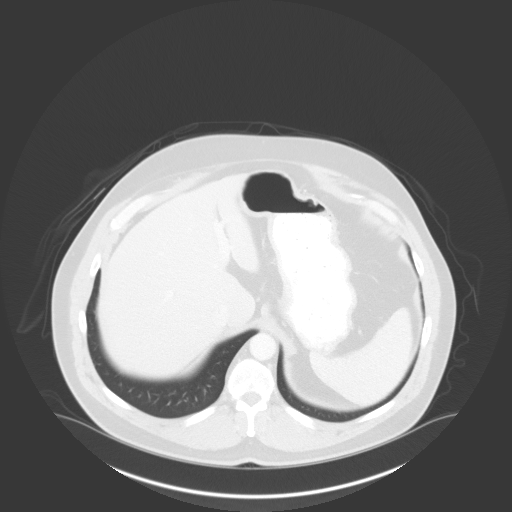
[im 90/98  lung]
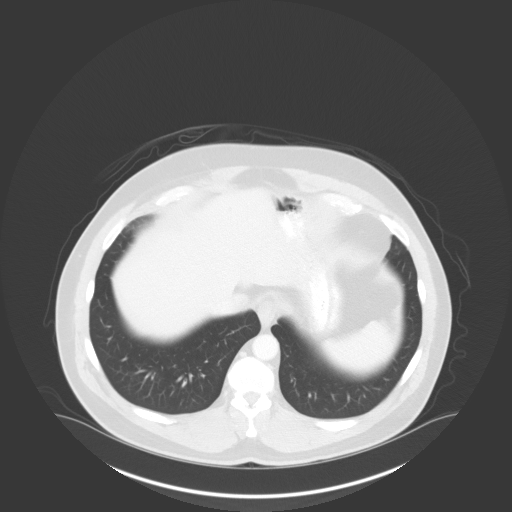
[im 94/98  soft-tissue]
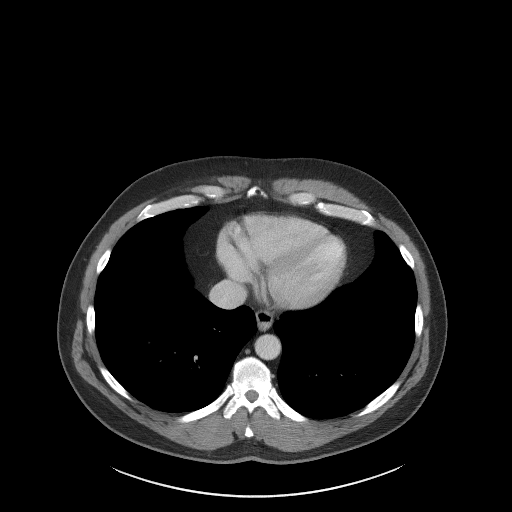
[im 94/98  lung]
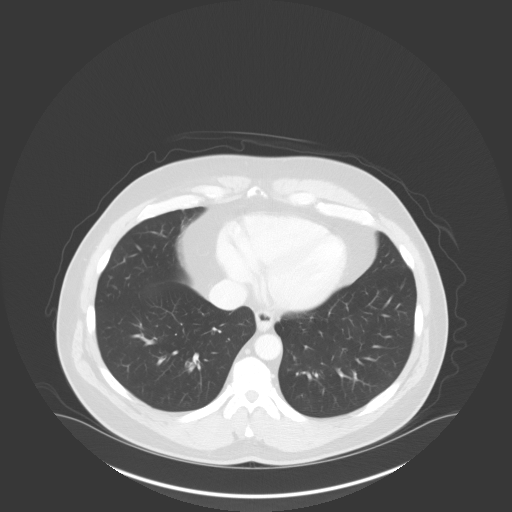

[15 of 32 positions shown; findings below may reference images not displayed]

FINDINGS: Lower Chest: No acute findings.

Hepatobiliary: No hepatic masses identified. A few tiny sub-cm
low-attenuation lesions are seen in the left lobe which are stable
and most likely represent tiny cysts. Gallbladder is unremarkable.
No evidence of biliary ductal dilatation.

Pancreas:  No mass or inflammatory changes.

Spleen: Within normal limits in size and appearance.

Adrenals/Urinary Tract: Multiple tiny less than 5 mm intrarenal
calculi are again seen in the upper pole of the right kidney. Stable
mild caliectasis is seen in the upper pole of the right kidney,
however there is no evidence of ureteral calculi or
hydroureteronephrosis. No renal masses are identified. Unremarkable
unopacified urinary bladder.

Stomach/Bowel: No evidence of obstruction, inflammatory process or
abnormal fluid collections. Normal appendix visualized.

Vascular/Lymphatic: No pathologically enlarged lymph nodes. No
abdominal aortic aneurysm.

Reproductive:  No mass or other significant abnormality.

Other:  None.

Musculoskeletal:  No suspicious bone lesions identified.
IMPRESSION: 1. Stable tiny right renal calculi. No evidence of ureteral calculi
or hydronephrosis.
2. No radiographic evidence of diverticulitis or other acute
findings.

## 2022-12-03 IMAGING — US US ABDOMEN LIMITED RUQ/ASCITES
1 series · 14 of 25 positions shown · non-contrast
Comparison: CT 03/23/2019

CLINICAL DATA: Right upper quadrant pain

EXAM:
ULTRASOUND ABDOMEN LIMITED RIGHT UPPER QUADRANT

[Series 1: us abdomen limited ruq/ascites · 0.30mm/px · 14 of 47 slices shown]
[im 1/47]
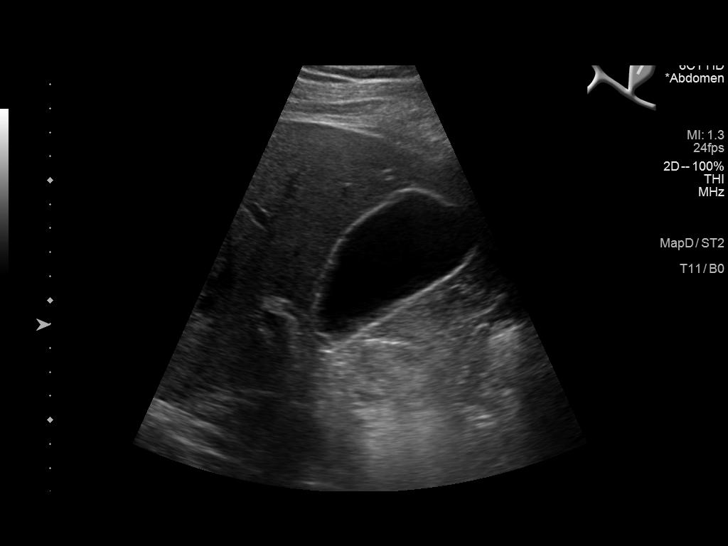
[im 4/47]
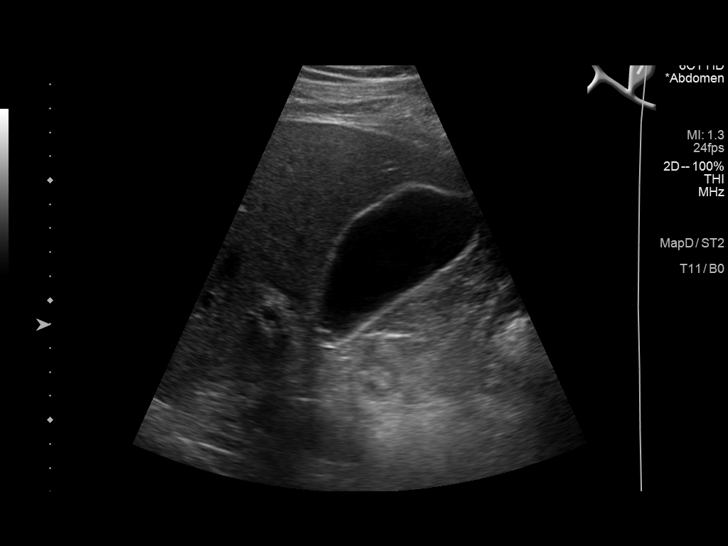
[im 8/47]
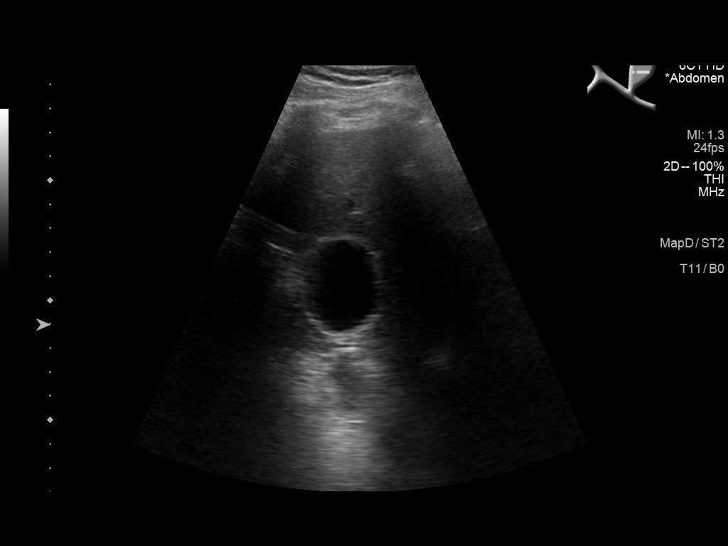
[im 12/47]
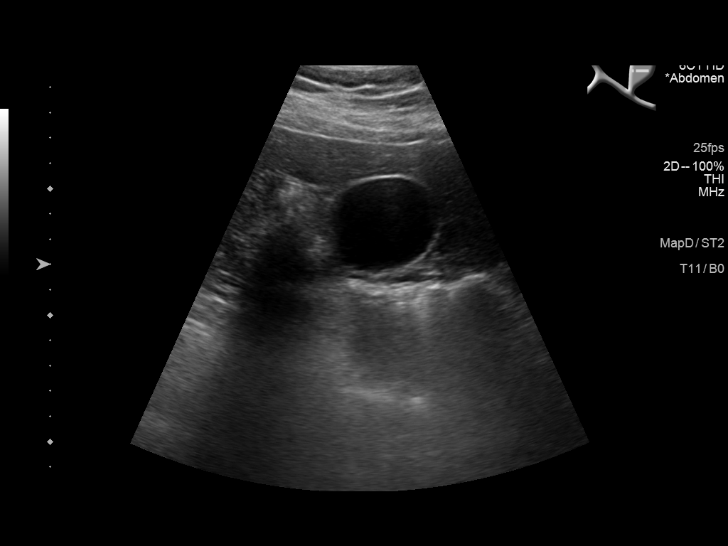
[im 16/47]
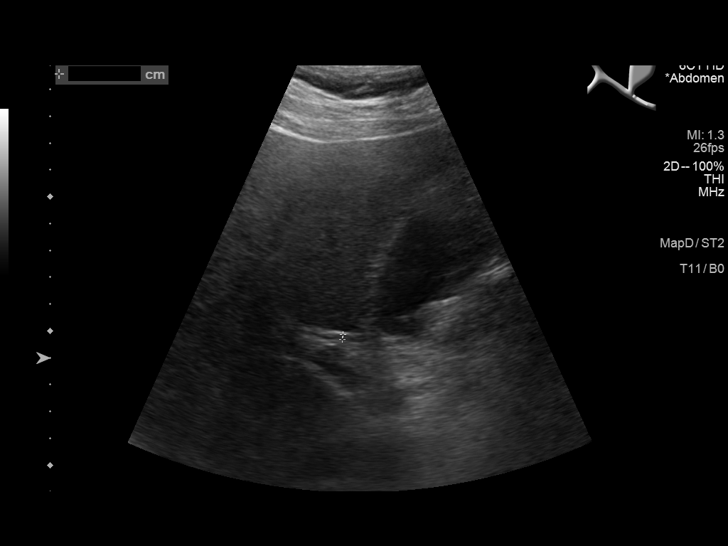
[im 18/47]
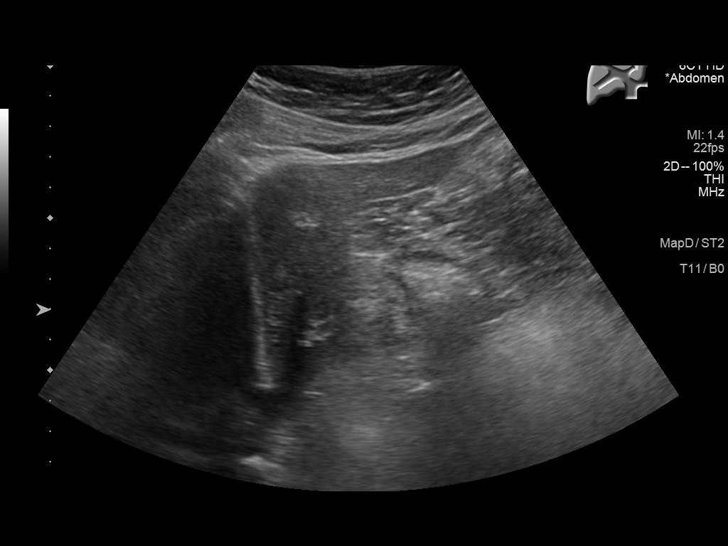
[im 22/47]
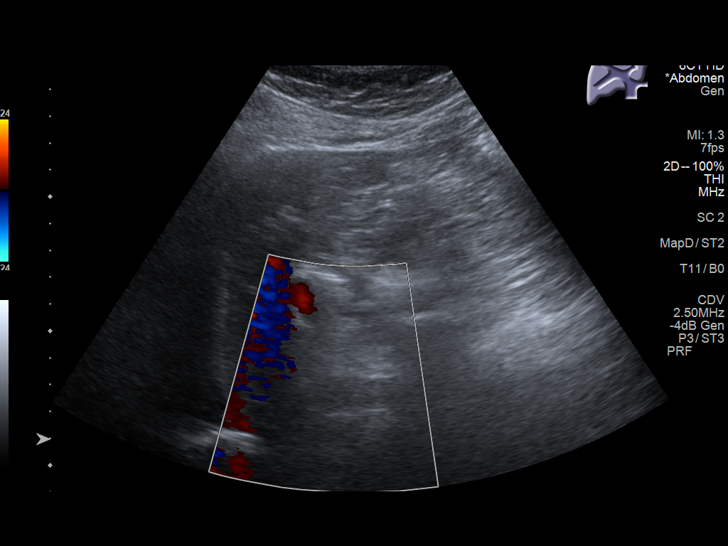
[im 25/47]
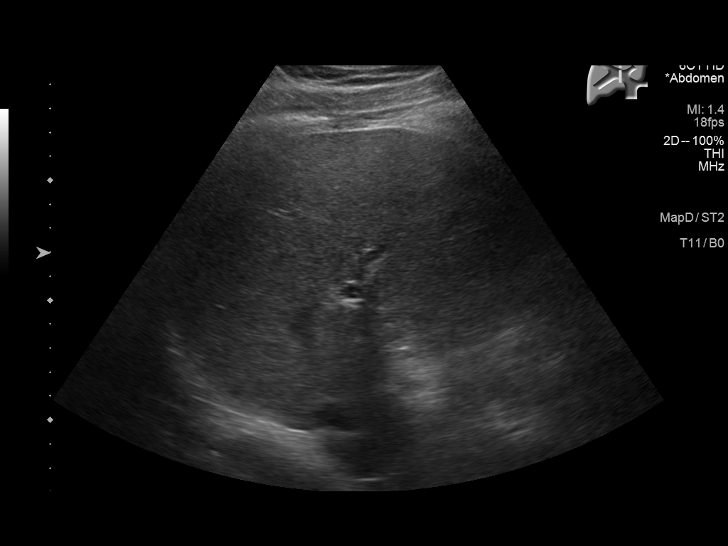
[im 29/47]
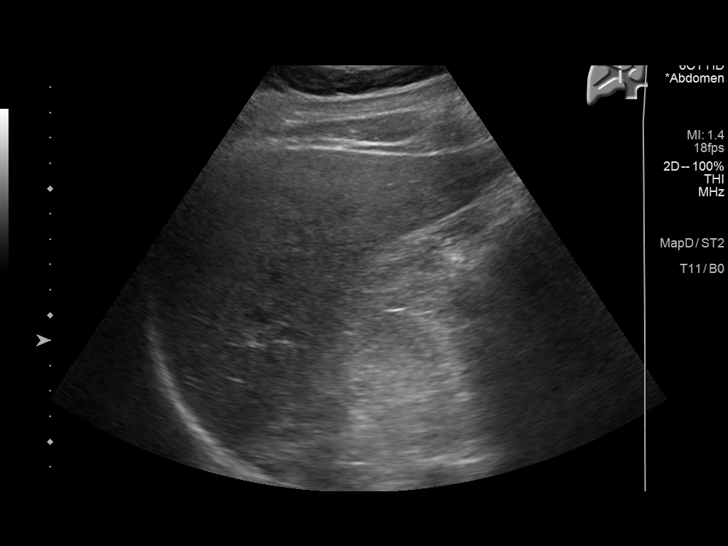
[im 31/47]
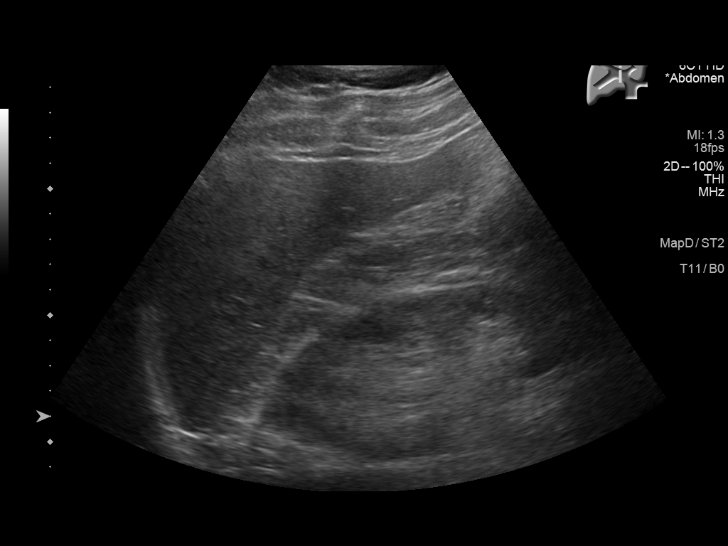
[im 35/47]
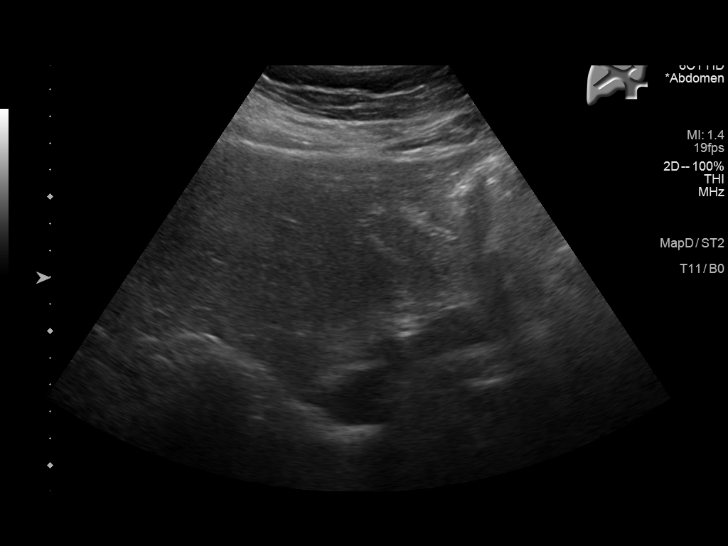
[im 39/47]
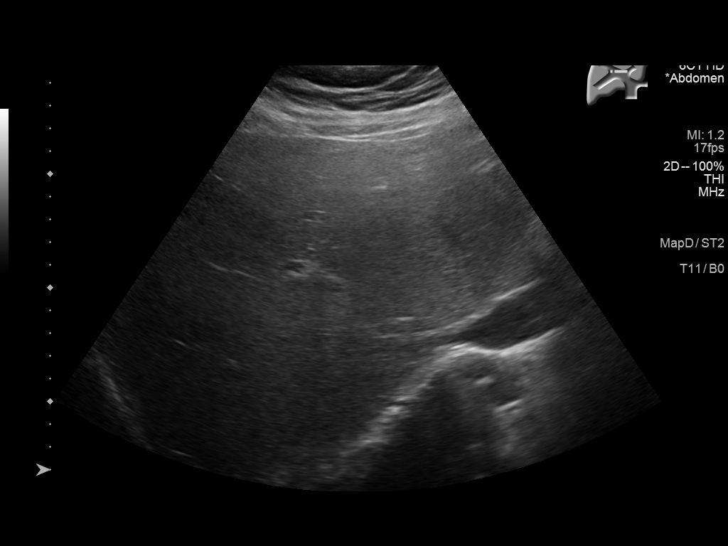
[im 43/47]
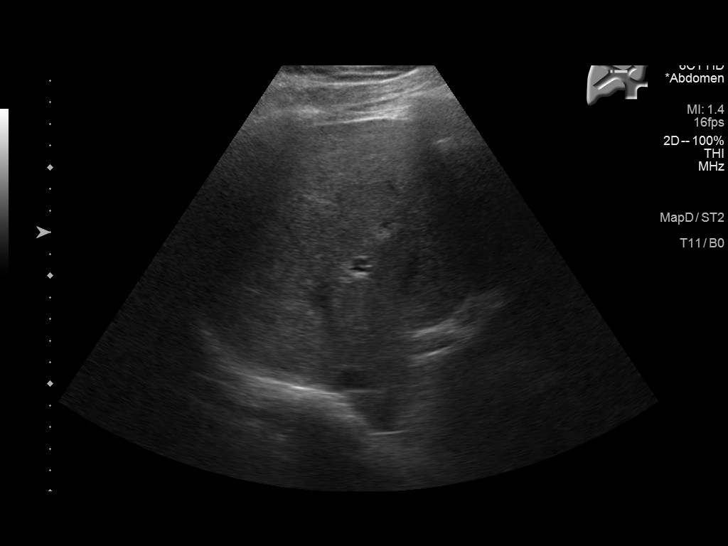
[im 47/47]
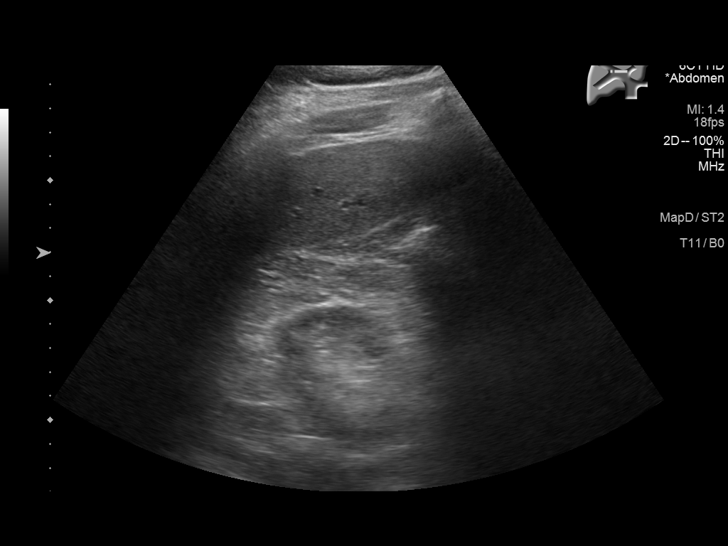

[14 of 25 positions shown; findings below may reference images not displayed]

FINDINGS: Gallbladder:

No gallstones or wall thickening visualized. No sonographic Murphy
sign noted by sonographer.

Common bile duct:

Diameter: Normal caliber, 2 mm

Liver:

Cystic area within the left hepatic lobe measures up to 13 mm.
Normal echotexture. No biliary dilatation. Portal vein is patent on
color Doppler imaging with normal direction of blood flow towards
the liver.

Other: None.
IMPRESSION: Small cystic area within the left hepatic lobe, 13 mm. This likely
reflects complex benign cyst.

No acute findings.

## 2023-07-07 ENCOUNTER — Other Ambulatory Visit: Payer: Self-pay | Admitting: Family Medicine

## 2023-07-07 DIAGNOSIS — R1011 Right upper quadrant pain: Secondary | ICD-10-CM

## 2023-07-11 ENCOUNTER — Ambulatory Visit
Admission: RE | Admit: 2023-07-11 | Discharge: 2023-07-11 | Disposition: A | Discharge status: Home / Self Care | Source: Ambulatory Visit | Attending: Family Medicine | Admitting: Family Medicine

## 2023-07-11 DIAGNOSIS — R1011 Right upper quadrant pain: Secondary | ICD-10-CM | POA: Diagnosis present

## 2024-01-22 ENCOUNTER — Ambulatory Visit (INDEPENDENT_AMBULATORY_CARE_PROVIDER_SITE_OTHER)

## 2024-01-22 ENCOUNTER — Ambulatory Visit
Admission: EM | Admit: 2024-01-22 | Discharge: 2024-01-22 | Disposition: A | Discharge status: Home / Self Care | Attending: Emergency Medicine | Admitting: Emergency Medicine

## 2024-01-22 DIAGNOSIS — R10A2 Flank pain, left side: Secondary | ICD-10-CM | POA: Insufficient documentation

## 2024-01-22 DIAGNOSIS — M545 Low back pain, unspecified: Secondary | ICD-10-CM | POA: Diagnosis present

## 2024-01-22 LAB — URINALYSIS, W/ REFLEX TO CULTURE (INFECTION SUSPECTED)
Bacteria, UA: NONE SEEN
Bilirubin Urine: NEGATIVE
Glucose, UA: NEGATIVE mg/dL
Hgb urine dipstick: NEGATIVE
Ketones, ur: NEGATIVE mg/dL
Leukocytes,Ua: NEGATIVE
Nitrite: NEGATIVE
Protein, ur: NEGATIVE mg/dL
Specific Gravity, Urine: 1.01 (ref 1.005–1.030)
Squamous Epithelial / HPF: NONE SEEN /HPF (ref 0–5)
WBC, UA: NONE SEEN WBC/hpf (ref 0–5)
pH: 7 (ref 5.0–8.0)

## 2024-01-22 MED ORDER — IBUPROFEN 600 MG PO TABS: 600.0000 mg | ORAL_TABLET | Freq: Four times a day (QID) | ORAL | 0 refills | Status: AC | PRN

## 2024-01-22 MED ORDER — BACLOFEN 10 MG PO TABS: 10.0000 mg | ORAL_TABLET | Freq: Three times a day (TID) | ORAL | 0 refills | Status: AC

## 2024-01-22 NOTE — Discharge Instructions (Addendum)
 Your urinalysis did not show any evidence of kidney stones.  Your x-ray does show that you have a large amount of stool throughout your colon and the radiologist sees several small scattered kidney stones in your right kidney but nothing on the left and nothing in your ureter or bladder.  I suspect that your pain is most likely musculoskeletal in nature.Take the ibuprofen , 600 mg every 6 hours with food, on a schedule for the next 48 hours and then as needed.  Take the baclofen, 10 mg every 8 hours, on a schedule for the next 48 hours and then as needed.  Apply moist heat to your back for 30 minutes at a time 2-3 times a day to improve blood flow to the area and help remove the lactic acid causing the spasm.  Follow the back exercises given at discharge.  Return for reevaluation for any new or worsening symptoms.

## 2024-01-22 NOTE — ED Triage Notes (Addendum)
 Sx x 8 days  Patient states that it started out feeling like a pulled muscle  in his back. Patient states that he's now having some discomfort on his lower left side of his back and groin area. Patient states that he has hx of kidney stones. Patient states that his back hurts worse when he moves a certain way.

## 2024-01-22 NOTE — ED Provider Notes (Signed)
 [facility]    CSN: [id number] Arrival date & time: 01/22/24  0955      History   Chief Complaint Chief Complaint  Patient presents with   Back Pain    HPI Ryan Miller is a 63 y.o. male.   HPI  64 year old male with past medical history significant for neuromuscular disorder, GERD, dysrhythmia, concussion, asthma, and kidney stones presents for evaluation of 8 days worth of left flank pain.  He reports initially he thought he may have pulled a muscle as the pain does increase with deep breathing and movement.  Over the last day or so he started to develop pain radiating to the left side of his groin and he is concerned it may be a kidney stone as it is now presenting similar to his stones in the past.  He has had some nausea but no vomiting, no visible blood in his urine, and no dysuria, urgency, or frequency.  Past Medical History:  Diagnosis Date   Asthma    Concussion 08/01/2014   Dysrhythmia    Rapid whens leans to side- Neg Stress 5-10 yrrs ago   GERD (gastroesophageal reflux disease)    Motion sickness    Neuromuscular disorder (HCC)    L arm numbness - bulging discs    Patient Active Problem List   Diagnosis Date Noted   Acid reflux 10/13/2013   Asthma, mild persistent 10/13/2013   Allergic rhinitis, seasonal 10/13/2013    Past Surgical History:  Procedure Laterality Date   COLONOSCOPY N/A 08/19/2014   Procedure: COLONOSCOPY;  Surgeon: Rogelia Copping, MD;  Location: [facility];  Service: Gastroenterology;  Laterality: N/A;   ESOPHAGOGASTRODUODENOSCOPY N/A 08/19/2014   Procedure: ESOPHAGOGASTRODUODENOSCOPY (EGD);  Surgeon: Rogelia Copping, MD;  Location: [facility];  Service: Gastroenterology;  Laterality: N/A;   NASAL SINUS SURGERY     TONSILLECTOMY         Home Medications    Prior to Admission medications   Medication Sig Start Date End Date Taking? Authorizing Provider  baclofen (LIORESAL) 10 MG tablet Take 1 tablet (10 mg  total) by mouth 3 (three) times daily. 01/22/24  Yes Bernardino Ditch, NP  fluticasone furoate-vilanterol (BREO ELLIPTA) 100-25 MCG/INH AEPB Inhale 1 puff into the lungs daily.   Yes [provider]  ibuprofen  (ADVIL ) 600 MG tablet Take 1 tablet (600 mg total) by mouth every 6 (six) hours as needed. 01/22/24  Yes Bernardino Ditch, NP  omeprazole (PRILOSEC) 20 MG capsule Take 20 mg by mouth daily.   Yes [provider]  albuterol  (PROAIR  HFA) 108 (90 Base) MCG/ACT inhaler Inhale 2 puffs into the lungs every 4 (four) hours as needed for wheezing or shortness of breath. 06/22/17   Mortenson, Ashley, MD  azelastine (ASTELIN) 0.1 % nasal spray Place into both nostrils daily. Use in each nostril as directed    [provider]  Cholecalciferol (VITAMIN D PO) Take by mouth.    [provider]  loratadine (CLARITIN) 10 MG tablet Take 10 mg by mouth daily. AM    [provider]  MAGNESIUM PO Take by mouth.    [provider]  montelukast (SINGULAIR) 10 MG tablet Take 10 mg by mouth at bedtime.    [provider]  Multiple Vitamins-Minerals (ZINC PO) Take by mouth.    [provider]  psyllium (METAMUCIL) 58.6 % packet Take 1 packet by mouth daily.    [provider]  tamsulosin (FLOMAX) 0.4 MG CAPS capsule TAKE  ONE CAPSULE BY MOUTH ONCE DAILY, 30 MINUTES AFTER SAME MEAL EACH DAY 01/01/19   [provider]    Family History History reviewed. No pertinent family history.  Social History Social History   Tobacco Use   Smoking status: Never   Smokeless tobacco: Never  Vaping Use   Vaping status: Never Used  Substance Use Topics   Alcohol use: No   Drug use: No     Allergies   Peanuts [peanut oil]   Review of Systems Review of Systems  Constitutional:  Negative for fever.  Gastrointestinal:  Positive for nausea. Negative for vomiting.  Genitourinary:  Positive for flank pain. Negative for dysuria, hematuria and  urgency.  Musculoskeletal:  Positive for back pain.     Physical Exam Triage Vital Signs ED Triage Vitals  Encounter Vitals Group     BP      Girls Systolic BP Percentile      Girls Diastolic BP Percentile      Boys Systolic BP Percentile      Boys Diastolic BP Percentile      Pulse      Resp      Temp      Temp src      SpO2      Weight      Height      Head Circumference      Peak Flow      Pain Score      Pain Loc      Pain Education      Exclude from Growth Chart    No data found.  Updated Vital Signs BP 127/84 (BP Location: Right Arm)   Pulse 70   Temp 98.6 F (37 C) (Oral)   Resp 17   Wt 217 lb (98.4 kg)   SpO2 94%   BMI 32.99 kg/m   Visual Acuity Right Eye Distance:   Left Eye Distance:   Bilateral Distance:    Right Eye Near:   Left Eye Near:    Bilateral Near:     Physical Exam Vitals and nursing note reviewed.  Constitutional:      Appearance: Normal appearance. He is not ill-appearing.  Cardiovascular:     Rate and Rhythm: Normal rate and regular rhythm.     Pulses: Normal pulses.     Heart sounds: Normal heart sounds. No murmur heard.    No friction rub. No gallop.  Pulmonary:     Effort: Pulmonary effort is normal.     Breath sounds: Normal breath sounds. No wheezing, rhonchi or rales.  Abdominal:     Tenderness: There is no right CVA tenderness or left CVA tenderness.  Skin:    General: Skin is warm and dry.     Capillary Refill: Capillary refill takes less than 2 seconds.  Neurological:     General: No focal deficit present.     Mental Status: He is alert and oriented to person, place, and time.      UC Treatments / Results  Labs (all labs ordered are listed, but only abnormal results are displayed) Labs Reviewed  URINALYSIS, W/ REFLEX TO CULTURE (INFECTION SUSPECTED)    EKG   Radiology DG Abdomen 1 View Result Date: 01/22/2024 CLINICAL DATA:  Left flank and groin pain. History of kidney stones. EXAM: ABDOMEN - 1  VIEW COMPARISON:  Remote CT scan from 2020 FINDINGS: The bowel gas pattern is unremarkable. The soft tissue shadows are maintained. Small scattered right renal calculi suspected. No  definite calcifications along the expected course of the ureters and no obvious bladder calculi. The bony structures are unremarkable. IMPRESSION: Small scattered right renal calculi suspected. No definite ureteral or bladder calculi. Unremarkable bowel gas pattern. Electronically Signed   By: MYRTIS Stammer M.D.   On: 01/22/2024 11:25    Procedures Procedures (including critical care time)  Medications Ordered in UC Medications - No data to display  Initial Impression / Assessment and Plan / UC Course  I have reviewed the triage vital signs and the nursing notes.  Pertinent labs & imaging results that were available during my care of the patient were reviewed by me and considered in my medical decision making (see chart for details).   Patient is a pleasant 63 year old male presenting for evaluation of a days worth of left flank pain as outlined in the HPI above.  He reports that at rest he has a pressure sensation but when he moves or takes a deep breath it is more of a sharp sensation.  When it started to radiate to his left groin this morning he was concerned that he may be passing a kidney stone.  He has not seen any blood in his urine but he reports that the symptoms are similar to when he has had stones in the past.  He has no CVA tenderness on exam though when he takes a deep breath he had a sharp catch in his left flank.  This is not reproducible to palpation of the musculature of the left flank.  I will order urinalysis to assess for the presence of infection, hematuria, or crystal formation which may suggest stone.  I will also obtain a KUB to evaluate for the presence of kidney stone.  KUB independent reviewed and evaluated by me.  Impression: Large volume of stool throughout the colon.  No visible radiopaque  calculi in either kidney or along the projected path of either ureter.  Radiology overread is pending. Radiology impression states small scattered right renal calculi suspected.  No definite ureteral or bladder calculi.  Unremarkable bowel pattern.  Urinalysis is unremarkable.  I will discharge patient home with diagnosis of musculoskeletal back pain and start him on ibuprofen  6 mg every 6 hours with food along with baclofen 10 mg every 8 hours to help with muscle spasm and tension.  I will give him home physical therapy exercises to perform and will encourage him to use moist heat to improve blood flow to the muscle group and improve his pain.  If his symptoms do not improve I will recommend he follow-up with orthopedics.   Final Clinical Impressions(s) / UC Diagnoses   Final diagnoses:  Left flank pain  Acute left-sided low back pain without sciatica     Discharge Instructions      Your urinalysis did not show any evidence of kidney stones.  Your x-ray does show that you have a large amount of stool throughout your colon and the radiologist sees several small scattered kidney stones in your right kidney but nothing on the left and nothing in your ureter or bladder.  I suspect that your pain is most likely musculoskeletal in nature.Take the ibuprofen , 600 mg every 6 hours with food, on a schedule for the next 48 hours and then as needed.  Take the baclofen, 10 mg every 8 hours, on a schedule for the next 48 hours and then as needed.  Apply moist heat to your back for 30 minutes at a time 2-3 times a  day to improve blood flow to the area and help remove the lactic acid causing the spasm.  Follow the back exercises given at discharge.  Return for reevaluation for any new or worsening symptoms.      ED Prescriptions     Medication Sig Dispense Auth. Provider   ibuprofen  (ADVIL ) 600 MG tablet Take 1 tablet (600 mg total) by mouth every 6 (six) hours as needed. 30 tablet Bernardino Ditch,  NP   baclofen (LIORESAL) 10 MG tablet Take 1 tablet (10 mg total) by mouth 3 (three) times daily. 30 each Bernardino Ditch, NP      PDMP not reviewed this encounter.   Bernardino Ditch, NP 01/22/24 1141
